# Patient Record
Sex: Female | Born: 1970 | Race: Black or African American | Hispanic: No | Marital: Single | Smoking: Never smoker
Health system: Southern US, Community
[De-identification: ages and names within clinical notes are randomized; demographics above are authoritative.]

## PROBLEM LIST (undated history)

## (undated) DIAGNOSIS — B977 Papillomavirus as the cause of diseases classified elsewhere: Secondary | ICD-10-CM

## (undated) DIAGNOSIS — T7840XA Allergy, unspecified, initial encounter: Secondary | ICD-10-CM

## (undated) HISTORY — DX: Allergy, unspecified, initial encounter: T78.40XA

---

## 2014-03-18 ENCOUNTER — Ambulatory Visit (INDEPENDENT_AMBULATORY_CARE_PROVIDER_SITE_OTHER): Payer: 59 | Admitting: Family Medicine

## 2014-03-18 ENCOUNTER — Other Ambulatory Visit: Payer: Self-pay | Admitting: Family Medicine

## 2014-03-18 VITALS — BP 112/60 | HR 82 | Temp 97.9°F | Resp 14 | Ht 72.5 in | Wt 194.4 lb

## 2014-03-18 DIAGNOSIS — IMO0001 Reserved for inherently not codable concepts without codable children: Secondary | ICD-10-CM

## 2014-03-18 DIAGNOSIS — N898 Other specified noninflammatory disorders of vagina: Secondary | ICD-10-CM

## 2014-03-18 DIAGNOSIS — Z0000 Encounter for general adult medical examination without abnormal findings: Secondary | ICD-10-CM

## 2014-03-18 DIAGNOSIS — Z309 Encounter for contraceptive management, unspecified: Secondary | ICD-10-CM

## 2014-03-18 DIAGNOSIS — B351 Tinea unguium: Secondary | ICD-10-CM

## 2014-03-18 DIAGNOSIS — Z1231 Encounter for screening mammogram for malignant neoplasm of breast: Secondary | ICD-10-CM

## 2014-03-18 LAB — POCT URINALYSIS DIPSTICK
Blood, UA: NEGATIVE
Glucose, UA: NEGATIVE
Ketones, UA: 15
Leukocytes, UA: NEGATIVE
Nitrite, UA: NEGATIVE
Spec Grav, UA: 1.025
Urobilinogen, UA: 4
pH, UA: 5.5

## 2014-03-18 LAB — POCT WET PREP WITH KOH
Clue Cells Wet Prep HPF POC: NEGATIVE
KOH Prep POC: NEGATIVE
Trichomonas, UA: NEGATIVE
Yeast Wet Prep HPF POC: NEGATIVE

## 2014-03-18 LAB — POCT UA - MICROSCOPIC ONLY
Casts, Ur, LPF, POC: NEGATIVE
Crystals, Ur, HPF, POC: NEGATIVE
Yeast, UA: NEGATIVE

## 2014-03-18 LAB — POCT URINE PREGNANCY: Preg Test, Ur: NEGATIVE

## 2014-03-18 MED ORDER — TERBINAFINE HCL 250 MG PO TABS: 250.0000 mg | ORAL_TABLET | Freq: Every day | ORAL | Status: DC

## 2014-03-18 MED ORDER — ETONOGESTREL-ETHINYL ESTRADIOL 0.12-0.015 MG/24HR VA RING: VAGINAL_RING | VAGINAL | Status: DC

## 2014-03-18 NOTE — Progress Notes (Signed)
Chief Complaint:  Chief Complaint  Patient presents with  . Annual Exam    HPI: Tracy Montoya is a [age] y.o. female who is here for annual with pap Last annual was 3-4 years ago [facility] de[facility] Has had abnormal pap yearsa go, when she was 24 G1A0L1 Menarche 14-17 Every 25-30 days, light flow 4-5 days, minimal cramping, no clots Does not do self breast exam Deneis breast, uterine, cervical cancer in the family Has not had mammogram Would like birth control, does not want depo or pills or implant, nonsmoker, no h/o DVTs No urinary sxs.   Past Medical History  Diagnosis Date  . Allergy    History reviewed. No pertinent past surgical history. History   Social History  . Marital Status: Single    Spouse Name: N/A    Number of Children: N/A  . Years of Education: N/A   Social History Main Topics  . Smoking status: None  . Smokeless tobacco: None  . Alcohol Use: None  . Drug Use: None  . Sexual Activity: None   Other Topics Concern  . None   Social History Narrative  . None   Family History  Problem Relation Age of Onset  . Cancer Maternal Grandfather    No Known Allergies Prior to Admission medications   Not on File     ROS: The patient denies fevers, chills, night sweats, unintentional weight loss, chest pain, palpitations, wheezing, dyspnea on exertion, nausea, vomiting, abdominal pain, dysuria, hematuria, melena, numbness, weakness, or tingling.   All other systems have been reviewed and were otherwise negative with the exception of those mentioned in the HPI and as above.    PHYSICAL EXAM: Filed Vitals:   03/18/14 1853  BP: 112/60  Pulse: 82  Temp: 97.9 F (36.6 C)   Filed Vitals:   03/18/14 1853  Height: 6' 0.5" (1.842 m)  Weight: 194 lb 6.4 oz (88.179 kg)   Body mass index is 25.99 kg/(m^2).  General: Alert, no acute distress HEENT:  Normocephalic, atraumatic, oropharynx patent. EOMI, PERRLA, tm n l, fundo exam nl Cardiovascular:   Regular rate and rhythm, no rubs murmurs or gallops.  No Carotid bruits, radial pulse intact. No pedal edema.  Respiratory: Clear to auscultation bilaterally.  No wheezes, rales, or rhonchi.  No cyanosis, no use of accessory musculature GI: No organomegaly, abdomen is soft and non-tender, positive bowel sounds.  No masses. Skin: No rashes. Neurologic: Facial musculature symmetric. Psychiatric: Patient is appropriate throughout our interaction. Lymphatic: No cervical lymphadenopathy Musculoskeletal: Gait intact. Breast and GU exam normal Cervix normal  LABS: Results for orders placed in visit on 03/18/14  POCT URINALYSIS DIPSTICK      Result Value Ref Range   Color, UA yellow     Clarity, UA clear     Glucose, UA neg     Bilirubin, UA small     Ketones, UA 15     Spec Grav, UA 1.025     Blood, UA neg     pH, UA 5.5     Protein, UA trace     Urobilinogen, UA 4.0     Nitrite, UA neg     Leukocytes, UA Negative    POCT UA - MICROSCOPIC ONLY      Result Value Ref Range   WBC, Ur, HPF, POC 2-4     RBC, urine, microscopic 0-1     Bacteria, U Microscopic trace     Mucus, UA 1+  Epithelial cells, urine per micros 2-3     Crystals, Ur, HPF, POC neg     Casts, Ur, LPF, POC neg     Yeast, UA neg    POCT WET PREP WITH KOH      Result Value Ref Range   Trichomonas, UA Negative     Clue Cells Wet Prep HPF POC neg     Epithelial Wet Prep HPF POC 1-3     Yeast Wet Prep HPF POC neg     Bacteria Wet Prep HPF POC small     RBC Wet Prep HPF POC 0-1     WBC Wet Prep HPF POC 2-4     KOH Prep POC Negative       EKG/XRAY:   Primary read interpreted by Dr. Conley Rolls at [facility].   ASSESSMENT/PLAN: Encounter Diagnoses  Name Primary?  . Annual physical exam Yes  . Onychomycosis   . Birth control   . Visit for screening mammogram   . Vaginal discharge    Rx nuvaring Rx Lamisil but not to take for another month until see if she has any SEs from nuvaring Return for fasting labs Today pap2,  urine and wet prep Screening mammogram pending Will let me know about GF colon cancer age at dx F/u prn   Gross sideeffects, risk and benefits, and alternatives of medications d/w patient. Patient is aware that all medications have potential sideeffects and we are unable to predict every sideeffect or drug-drug interaction that may occur.  Tracy Sonnen PHUONG, DO 03/18/2014 8:15 PM

## 2014-03-18 NOTE — Patient Instructions (Signed)

## 2014-03-19 ENCOUNTER — Telehealth: Payer: Self-pay

## 2014-03-19 DIAGNOSIS — Z01419 Encounter for gynecological examination (general) (routine) without abnormal findings: Secondary | ICD-10-CM

## 2014-03-19 NOTE — Telephone Encounter (Signed)
Patient called and I advised of the message below. Patient transferred to manager.

## 2014-03-19 NOTE — Telephone Encounter (Signed)
I have called and left a detailed message for the patient on her voice mail.  I have changed her pap smear to include gonorrhea and chlamydia.  If she is also interested in blood STD testing (HIV syphilis and Hepatitis) please let me know.  These labs should be covered because one of her diagnoses was vaginal discharge but the coverage will be according to her insurance regarding her STD testing.  She will be charge to RTC on Sunday because her agreement with her insurance company is that she will pay a copay at the time of service and because her blood will be drawn on a different day she will have to pay a copay for the blood draw.  That is the agreement with her insurance company.  We do not always have a clinic person available to talk to the patient on the phone and that is why she could not talk with someone.  Her phone call went into a phone system and she was going to be called back within 48h of her original phone call.   LAB -- please call and change her pap to a PAP #6 I have ordered it under yesterdays visit in EPIC.  Please see me if there are any questions.  Thanks

## 2014-03-19 NOTE — Telephone Encounter (Signed)
Patient is calling again wanting to know if she could have an STD screening done as part of her annual physical when she comes back on Sunday for her lab work. Patient states she was not told that we would not test for STD when we did pap. °

## 2014-03-19 NOTE — Telephone Encounter (Deleted)
Patient is calling again wanting to know if she could have an STD screening done as part of her annual physical when she comes back on Sunday for her lab work. Patient states she was not told that we would not test for STD when we did pap.

## 2014-03-19 NOTE — Telephone Encounter (Signed)
Pt called about a pap she had done last night, she did not mention to the Dr. Claiborne Billings she also wanted an STD test to be done, wants to know if it is too late, and if it is, if she can come in and still include this as part of her physical exam

## 2014-03-19 NOTE — Telephone Encounter (Signed)
Patient is upset because her STD was not included in her OV last night.   She is also upset b/c no one was available to answer her question.  Or statement"  THE STD IS COVERED UNDER THE WELLNESS VISIT, AND I WILL NOT BE CHARGED EXTRA for returning on Sunday for her fasting labs and STD testing."  Please leave a detailed message on her voice machine.  One) Explain why we do not automatically do an STD during a wellness OV IF this is true.  Two)  Explain why someone was not available to talk with the patient when she called and now has to play phone tag with her.   [phone]  This is her only number according to her.

## 2014-03-20 ENCOUNTER — Encounter: Payer: Self-pay | Admitting: Family Medicine

## 2014-03-23 ENCOUNTER — Encounter: Payer: Self-pay | Admitting: Family Medicine

## 2014-03-23 LAB — PAP IG, CT-NG NAA, HPV HIGH-RISK
Chlamydia Probe Amp: NEGATIVE
GC Probe Amp: NEGATIVE
HPV DNA High Risk: NOT DETECTED

## 2014-03-23 NOTE — Telephone Encounter (Signed)
LM about normal pap, she can return for labs and per my conversation with Laurelyn SickleKatina we would wave the co pay since I was in error about no copay for labs. I had told her she would have a lab fee but did not know that she would  Also have to pay a co pay on top of her [facility] lab fees and venipuncture and handling.

## 2014-03-25 ENCOUNTER — Encounter: Payer: Self-pay | Admitting: Family Medicine

## 2014-03-27 ENCOUNTER — Ambulatory Visit (HOSPITAL_COMMUNITY): Payer: Self-pay | Attending: Family Medicine

## 2014-04-07 ENCOUNTER — Encounter: Payer: Self-pay | Admitting: Family Medicine

## 2015-04-08 ENCOUNTER — Other Ambulatory Visit: Payer: Self-pay | Admitting: Family Medicine

## 2015-04-08 NOTE — Telephone Encounter (Signed)
Patient is requesting refill on birth control  She was last seen [id number].  Please call ASAP  [phone] (H)

## 2015-04-09 NOTE — Telephone Encounter (Signed)
Does pt need to RTC? I called pt to see when she could come in. Left message on VM.

## 2015-04-10 ENCOUNTER — Other Ambulatory Visit: Payer: Self-pay | Admitting: Family Medicine

## 2015-04-10 MED ORDER — ETONOGESTREL-ETHINYL ESTRADIOL 0.12-0.015 MG/24HR VA RING: VAGINAL_RING | VAGINAL | Status: DC

## 2015-04-10 NOTE — Progress Notes (Signed)
Refilled for 3 nuvarings, she needs to come in int he next 1-3 months for recheck.

## 2016-10-03 ENCOUNTER — Encounter: Payer: Self-pay | Admitting: Physician Assistant

## 2016-10-03 ENCOUNTER — Ambulatory Visit (INDEPENDENT_AMBULATORY_CARE_PROVIDER_SITE_OTHER): Payer: 59 | Admitting: Physician Assistant

## 2016-10-03 VITALS — BP 118/72 | HR 78 | Temp 98.3°F | Resp 16 | Ht 72.5 in | Wt 177.0 lb

## 2016-10-03 DIAGNOSIS — Z130 Encounter for screening for diseases of the blood and blood-forming organs and certain disorders involving the immune mechanism: Secondary | ICD-10-CM | POA: Diagnosis not present

## 2016-10-03 DIAGNOSIS — Z113 Encounter for screening for infections with a predominantly sexual mode of transmission: Secondary | ICD-10-CM

## 2016-10-03 DIAGNOSIS — Z114 Encounter for screening for human immunodeficiency virus [HIV]: Secondary | ICD-10-CM | POA: Diagnosis not present

## 2016-10-03 DIAGNOSIS — Z0000 Encounter for general adult medical examination without abnormal findings: Secondary | ICD-10-CM | POA: Diagnosis not present

## 2016-10-03 DIAGNOSIS — Z23 Encounter for immunization: Secondary | ICD-10-CM | POA: Diagnosis not present

## 2016-10-03 DIAGNOSIS — Z3044 Encounter for surveillance of vaginal ring hormonal contraceptive device: Secondary | ICD-10-CM | POA: Diagnosis not present

## 2016-10-03 DIAGNOSIS — Z1329 Encounter for screening for other suspected endocrine disorder: Secondary | ICD-10-CM

## 2016-10-03 DIAGNOSIS — Z13220 Encounter for screening for lipoid disorders: Secondary | ICD-10-CM | POA: Diagnosis not present

## 2016-10-03 DIAGNOSIS — Z1389 Encounter for screening for other disorder: Secondary | ICD-10-CM

## 2016-10-03 DIAGNOSIS — B351 Tinea unguium: Secondary | ICD-10-CM

## 2016-10-03 DIAGNOSIS — H409 Unspecified glaucoma: Secondary | ICD-10-CM | POA: Diagnosis not present

## 2016-10-03 DIAGNOSIS — Z13228 Encounter for screening for other metabolic disorders: Secondary | ICD-10-CM

## 2016-10-03 LAB — POCT URINALYSIS DIP (MANUAL ENTRY)
Bilirubin, UA: NEGATIVE
Blood, UA: NEGATIVE
GLUCOSE UA: NEGATIVE
Ketones, POC UA: NEGATIVE
LEUKOCYTES UA: NEGATIVE
NITRITE UA: NEGATIVE
PROTEIN UA: NEGATIVE
SPEC GRAV UA: 1.015
UROBILINOGEN UA: 0.2
pH, UA: 7

## 2016-10-03 MED ORDER — ETONOGESTREL-ETHINYL ESTRADIOL 0.12-0.015 MG/24HR VA RING: VAGINAL_RING | VAGINAL | 4 refills | Status: DC

## 2016-10-03 MED ORDER — LEVONORGESTREL 1.5 MG PO TABS: 1.5000 mg | ORAL_TABLET | Freq: Once | ORAL | 0 refills | Status: AC

## 2016-10-03 MED ORDER — TERBINAFINE HCL 250 MG PO TABS: 250.0000 mg | ORAL_TABLET | Freq: Every day | ORAL | 0 refills | Status: DC

## 2016-10-03 NOTE — Progress Notes (Signed)
Patient ID: Tracy Montoya, female    DOB: 12/24/1970, 45 y.o.   MRN: [id number]  PCP: No PCP Per Patient  Chief Complaint  Patient presents with  . Annual Exam    with pap   . Medication Refill    wants a refill on her nuvaring as well as back up birth control   . Other    patient also wants to have some more medication for her toenail fungus.  She states she never did get the Rx filled    Subjective:   Presents for [employer] [facility].  Last pap 03/18/2014. Cytology negative, HPV negative Has had abnormal pap years ago, when she was 24. Had a biopsy and then cryotherapy. G1A0L1 Menarche [age] Every 25-30 days, light flow 4-5 days, minimal cramping, no clots Does not do self breast exam Mammogram this year. [place] [facility] [place]. Denies breast, uterine, cervical cancer in the family.  Declined seasonal influenza vaccine. "I just don't get that." Needs HIV screening. Desires STI screening. Thinks she had Tdap in 2007.  Rx'd terbinafine 03/2014, never filled the Rx. Is interested in having a new Rx.     Patient Active Problem List   Diagnosis Date Noted  . Glaucoma 10/03/2016    Past Medical History:  Diagnosis Date  . Allergy      Prior to Admission medications   Medication Sig Start Date End Date Taking? Authorizing Provider  etonogestrel-ethinyl estradiol (NUVARING) 0.12-0.015 MG/24HR vaginal ring INSERT VAGINALLY AND LEAVE IN PLACE FOR 3 CONSECUTIVE WEEKS, THEN REMOVE FOR 1 WEEK 04/10/15  Yes Thao P Le, DO  Travoprost, BAK Free, (TRAVATAN) 0.004 % SOLN ophthalmic solution      yes   terbinafine (LAMISIL) 250 MG tablet Take 1 tablet (250 mg total) by mouth daily. 03/18/14  Yes Thao P Le, DO    No Known Allergies  History reviewed. No pertinent surgical history.  Family History  Problem Relation Age of Onset  . Cancer Maternal Grandfather     Social History   Social History  . Marital status: Single    Spouse name: N/A  . Number  of children: 1  . Years of education: Associate's Degree   Occupational History  . Shipping     [name]   Social History Main Topics  . Smoking status: Never Smoker  . Smokeless tobacco: Never Used  . Alcohol use No  . Drug use: No  . Sexual activity: Yes    Partners: Male    Birth control/ protection: Other-see comments     Comment: NuvaRing   Other Topics Concern  . None   Social History Narrative   Lives with her adult daughter.   Attended [employer]. Obtained GED and then Associate's Degree at [employer].       Review of Systems  Constitutional: Negative for chills and fever.  HENT: Negative for congestion and sore throat.   Eyes: Negative for pain.  Respiratory: Negative for cough, shortness of breath and wheezing.   Cardiovascular: Negative for chest pain, palpitations and leg swelling.  Gastrointestinal: Negative for abdominal pain, blood in stool, constipation, diarrhea, nausea and vomiting.  Endocrine: Negative for polydipsia.  Genitourinary: Negative for dysuria, frequency, hematuria and urgency.  Musculoskeletal: Negative for back pain, myalgias and neck pain.  Skin: Negative for rash and wound.       Toenails thickened and discolored  Allergic/Immunologic: Negative for environmental allergies.  Neurological: Negative for dizziness, weakness and headaches.  Hematological: Does  not bruise/bleed easily.  Psychiatric/Behavioral: Negative for suicidal ideas. The patient is not nervous/anxious.         Objective:  Physical Exam  Constitutional: She is oriented to person, place, and time. Vital signs are normal. She appears well-developed and well-nourished. She is active and cooperative. No distress.  BP 118/72 (BP Location: Right Arm, Patient Position: Sitting, Cuff Size: Normal)   Pulse 78   Temp 98.3 F (36.8 C) (Oral)   Resp 16   Ht 6' 0.5" (1.842 m)   Wt 177 lb (80.3 kg)   LMP 09/08/2016   SpO2 100%   BMI 23.68 kg/m    HENT:  Head: Normocephalic  and atraumatic.  Right Ear: Hearing, tympanic membrane, external ear and ear canal normal. No foreign bodies.  Left Ear: Hearing, tympanic membrane, external ear and ear canal normal. No foreign bodies.  Nose: Nose normal.  Mouth/Throat: Uvula is midline, oropharynx is clear and moist and mucous membranes are normal. No oral lesions. Normal dentition. No dental abscesses or uvula swelling. No oropharyngeal exudate.  Eyes: Conjunctivae, EOM and lids are normal. Pupils are equal, round, and reactive to light. Right eye exhibits no discharge. Left eye exhibits no discharge. No scleral icterus.  Fundoscopic exam:      The right eye shows no arteriolar narrowing, no AV nicking, no exudate, no hemorrhage and no papilledema. The right eye shows red reflex.       The left eye shows no arteriolar narrowing, no AV nicking, no exudate, no hemorrhage and no papilledema. The left eye shows red reflex.  Neck: Trachea normal, normal range of motion and full passive range of motion without pain. Neck supple. No spinous process tenderness and no muscular tenderness present. No thyroid mass and no thyromegaly present.  Cardiovascular: Normal rate, regular rhythm, normal heart sounds, intact distal pulses and normal pulses.   Pulmonary/Chest: Effort normal and breath sounds normal. Right breast exhibits no inverted nipple, no mass, no nipple discharge, no skin change and no tenderness. Left breast exhibits no inverted nipple, no mass, no nipple discharge, no skin change and no tenderness. Breasts are symmetrical.  Musculoskeletal: She exhibits no edema or tenderness.       Cervical back: Normal.       Thoracic back: Normal.       Lumbar back: Normal.  Lymphadenopathy:       Head (right side): No tonsillar, no preauricular, no posterior auricular and no occipital adenopathy present.       Head (left side): No tonsillar, no preauricular, no posterior auricular and no occipital adenopathy present.    She has no  cervical adenopathy.       Right: No supraclavicular adenopathy present.       Left: No supraclavicular adenopathy present.  Neurological: She is alert and oriented to person, place, and time. She has normal strength and normal reflexes. No cranial nerve deficit. She exhibits normal muscle tone. Coordination and gait normal.  Skin: Skin is warm, dry and intact. No rash noted. She is not diaphoretic. No cyanosis or erythema. Nails show no clubbing.  Psychiatric: She has a normal mood and affect. Her speech is normal and behavior is normal. Judgment and thought content normal.           Assessment & Plan:  1. Annual physical exam Age appropriate anticipatory guidance provided. - Care order/instruction:  2. Encounter for surveillance of vaginal ring hormonal contraceptive device Continue NuvaRing with PRN Plan B.  - etonogestrel-ethinyl estradiol (NUVARING)  0.12-0.015 MG/24HR vaginal ring; INSERT VAGINALLY AND LEAVE IN PLACE FOR 3 CONSECUTIVE WEEKS, THEN REMOVE FOR 1 WEEK  Dispense: 3 each; Refill: 4 - levonorgestrel (PLAN B ONE-STEP) 1.5 MG tablet; Take 1 tablet (1.5 mg total) by mouth once.  Dispense: 1 tablet; Refill: 0  3. Screening for blood or protein in urine - POCT urinalysis dipstick - Urine Microscopic  4. Screening for metabolic disorder - Comprehensive metabolic panel  5. Screening for HIV (human immunodeficiency virus) - HIV antibody  6. Screening for hyperlipidemia - Lipid panel  7. Screening for thyroid disorder - TSH  8. Screening for deficiency anemia - CBC with Differential/Platelet  9. Need for Tdap vaccination - Tdap vaccine greater than or equal to 7yo IM  10. Onychomycosis RTC 4 weeks after starting terbinafine for ALT, AST. - terbinafine (LAMISIL) 250 MG tablet; Take 1 tablet (250 mg total) by mouth daily.  Dispense: 90 tablet; Refill: 0  11. Routine screening for STI (sexually transmitted infection) Encouraged consistent condom use. - Chlamydia  trachomatis, DNA, amp probe - Gonococcus DNA, PCR - Hepatitis B surface antibody - Hepatitis B surface antigen - Hepatitis C antibody - RPR  12. Glaucoma, unspecified glaucoma type, unspecified laterality Managed by ophthalmology and optometry.   Fernande Bras, PA-C Physician Assistant-Certified [facility]

## 2016-10-03 NOTE — Patient Instructions (Addendum)
   IF you received an x-ray today, you will receive an invoice from [facility]. Please contact [facility] at [phone] with questions or concerns regarding your invoice.   IF you received labwork today, you will receive an invoice from [facility]. Please contact [facility] at [phone] with questions or concerns regarding your invoice.   Our billing staff will not be able to assist you with questions regarding bills from these companies.  You will be contacted with the lab results as soon as they are available. The fastest way to get your results is to activate your My Chart account. Instructions are located on the last page of this paperwork. If you have not heard from us regarding the results in 2 weeks, please contact this office.    Keeping You Healthy  Get These Tests 1. Blood Pressure- Have your blood pressure checked once a year by your health care provider.  Normal blood pressure is 120/80. 2. Weight- Have your body mass index (BMI) calculated to screen for obesity.  BMI is measure of body fat based on height and weight.  You can also calculate your own BMI at www.nhlbisupport.com/bmi/. 3. Cholesterol- Have your cholesterol checked every 5 years starting at age 20 then yearly starting at age 45. 4. Chlamydia, HIV, and other sexually transmitted diseases- Get screened every year until age 25, then within three months of each new sexual provider. 5. Pap Test - Every 1-5 years; discuss with your health care provider. 6. Mammogram- Every 1-2 years starting at age 40--50  Take these medicines  Calcium with Vitamin D-Your body needs 1200 mg of Calcium each day and 800-1000 IU of Vitamin D daily.  Your body can only absorb 500 mg of Calcium at a time so Calcium must be taken in 2 or 3 divided doses throughout the day.  Multivitamin with folic acid- Once daily if it is possible for you to become pregnant.  Get these Immunizations  Gardasil-Series of three doses;  prevents HPV related illness such as genital warts and cervical cancer.  Menactra-Single dose; prevents meningitis.  Tetanus shot- Every 10 years.  Flu shot-Every year.  Take these steps 1. Do not smoke-Your healthcare provider can help you quit.  For tips on how to quit go to www.smokefree.gov or call 1-800 QUITNOW. 2. Be physically active- Exercise 5 days a week for at least 30 minutes.  If you are not already physically active, start slow and gradually work up to 30 minutes of moderate physical activity.  Examples of moderate activity include walking briskly, dancing, swimming, bicycling, etc. 3. Breast Cancer- A self breast exam every month is important for early detection of breast cancer.  For more information and instruction on self breast exams, ask your healthcare provider or www.womenshealth.gov/faq/breast-self-exam.cfm. 4. Eat a healthy diet- Eat a variety of healthy foods such as fruits, vegetables, whole grains, low fat milk, low fat cheeses, yogurt, lean meats, poultry and fish, beans, nuts, tofu, etc.  For more information go to www. Thenutritionsource.org 5. Drink alcohol in moderation- Limit alcohol intake to one drink or less per day. Never drink and drive. 6. Depression- Your emotional health is as important as your physical health.  If you're feeling down or losing interest in things you normally enjoy please talk to your healthcare provider about being screened for depression. 7. Dental visit- Brush and floss your teeth twice daily; visit your dentist twice a year. 8. Eye doctor- Get an eye exam at least every 2 years. 9. Helmet   use- Always wear a helmet when riding a bicycle, motorcycle, rollerblading or skateboarding. 10. Safe sex- If you may be exposed to sexually transmitted infections, use a condom. 11. Seat belts- Seat belts can save your live; always wear one. 12. Smoke/Carbon Monoxide detectors- These detectors need to be installed on the appropriate level of your  home. Replace batteries at least once a year. 13. Skin cancer- When out in the sun please cover up and use sunscreen 15 SPF or higher. 14. Violence- If anyone is threatening or hurting you, please tell your healthcare provider.        

## 2016-10-04 LAB — COMPREHENSIVE METABOLIC PANEL
A/G RATIO: 1.7 (ref 1.2–2.2)
ALK PHOS: 42 IU/L (ref 39–117)
ALT: 14 IU/L (ref 0–32)
AST: 15 IU/L (ref 0–40)
Albumin: 4.3 g/dL (ref 3.5–5.5)
BILIRUBIN TOTAL: 0.7 mg/dL (ref 0.0–1.2)
BUN/Creatinine Ratio: 14 (ref 9–23)
BUN: 12 mg/dL (ref 6–24)
CHLORIDE: 101 mmol/L (ref 96–106)
CO2: 25 mmol/L (ref 18–29)
Calcium: 9.6 mg/dL (ref 8.7–10.2)
Creatinine, Ser: 0.86 mg/dL (ref 0.57–1.00)
GFR calc Af Amer: 96 mL/min/{1.73_m2} (ref >59)
GFR calc non Af Amer: 83 mL/min/{1.73_m2} (ref >59)
GLOBULIN, TOTAL: 2.6 g/dL (ref 1.5–4.5)
Glucose: 92 mg/dL (ref 65–99)
POTASSIUM: 4.4 mmol/L (ref 3.5–5.2)
SODIUM: 140 mmol/L (ref 134–144)
Total Protein: 6.9 g/dL (ref 6.0–8.5)

## 2016-10-04 LAB — CBC WITH DIFFERENTIAL/PLATELET
BASOS: 1 %
Basophils Absolute: 0 10*3/uL (ref 0.0–0.2)
EOS (ABSOLUTE): 0.1 10*3/uL (ref 0.0–0.4)
EOS: 2 %
HEMATOCRIT: 40.4 % (ref 34.0–46.6)
Hemoglobin: 13.5 g/dL (ref 11.1–15.9)
Immature Grans (Abs): 0 10*3/uL (ref 0.0–0.1)
Immature Granulocytes: 0 %
Lymphocytes Absolute: 1.8 10*3/uL (ref 0.7–3.1)
Lymphs: 32 %
MCH: 30.5 pg (ref 26.6–33.0)
MCHC: 33.4 g/dL (ref 31.5–35.7)
MCV: 91 fL (ref 79–97)
Monocytes Absolute: 0.4 10*3/uL (ref 0.1–0.9)
Monocytes: 6 %
Neutrophils Absolute: 3.4 10*3/uL (ref 1.4–7.0)
Neutrophils: 59 %
Platelets: 252 10*3/uL (ref 150–379)
RBC: 4.43 x10E6/uL (ref 3.77–5.28)
RDW: 13.8 % (ref 12.3–15.4)
WBC: 5.8 10*3/uL (ref 3.4–10.8)

## 2016-10-04 LAB — LIPID PANEL
CHOLESTEROL TOTAL: 163 mg/dL (ref 100–199)
Chol/HDL Ratio: 2.2 ratio units (ref 0.0–4.4)
HDL: 73 mg/dL (ref >39)
LDL Calculated: 79 mg/dL (ref 0–99)
Triglycerides: 56 mg/dL (ref 0–149)
VLDL Cholesterol Cal: 11 mg/dL (ref 5–40)

## 2016-10-04 LAB — URINALYSIS, MICROSCOPIC ONLY
Bacteria, UA: NONE SEEN
Casts: NONE SEEN /lpf
RBC MICROSCOPIC, UA: NONE SEEN /HPF (ref 0–?)

## 2016-10-04 LAB — RPR: RPR Ser Ql: NONREACTIVE

## 2016-10-04 LAB — HEPATITIS B SURFACE ANTIBODY, QUANTITATIVE: Hepatitis B Surf Ab Quant: <3.1 m[IU]/mL — ABNORMAL LOW

## 2016-10-04 LAB — HEPATITIS B SURFACE ANTIGEN: Hepatitis B Surface Ag: NEGATIVE

## 2016-10-04 LAB — HEPATITIS C ANTIBODY: Hep C Virus Ab: <0.1 {s_co_ratio} (ref 0.0–0.9)

## 2016-10-04 LAB — HIV ANTIBODY (ROUTINE TESTING W REFLEX): HIV Screen 4th Generation wRfx: NONREACTIVE

## 2016-10-04 LAB — TSH: TSH: 0.689 u[IU]/mL (ref 0.450–4.500)

## 2016-10-05 ENCOUNTER — Encounter: Payer: Self-pay | Admitting: Physician Assistant

## 2016-10-05 LAB — CHLAMYDIA TRACHOMATIS, DNA, AMP PROBE: Chlamydia trachomatis, NAA: NEGATIVE

## 2016-10-05 LAB — GONOCOCCUS DNA, PCR: Neisseria gonorrhoeae by PCR: NEGATIVE

## 2016-10-10 ENCOUNTER — Encounter: Payer: Self-pay | Admitting: Physician Assistant

## 2016-11-24 ENCOUNTER — Telehealth: Payer: Self-pay | Admitting: Family Medicine

## 2016-11-24 DIAGNOSIS — Z3044 Encounter for surveillance of vaginal ring hormonal contraceptive device: Secondary | ICD-10-CM

## 2016-11-24 MED ORDER — ETONOGESTREL-ETHINYL ESTRADIOL 0.12-0.015 MG/24HR VA RING: VAGINAL_RING | VAGINAL | 4 refills | Status: DC

## 2016-11-24 NOTE — Telephone Encounter (Signed)
Pt calling stating that she need her Nuvaring sent to [facility] because its a maintenance drug the pharmacy want fill it please call [facility] at [phone] Fax number [phone]

## 2017-02-28 ENCOUNTER — Ambulatory Visit (INDEPENDENT_AMBULATORY_CARE_PROVIDER_SITE_OTHER): Payer: 59

## 2017-02-28 ENCOUNTER — Encounter: Payer: Self-pay | Admitting: Physician Assistant

## 2017-02-28 ENCOUNTER — Ambulatory Visit (INDEPENDENT_AMBULATORY_CARE_PROVIDER_SITE_OTHER): Payer: 59 | Admitting: Physician Assistant

## 2017-02-28 VITALS — BP 121/70 | HR 75 | Temp 98.3°F | Resp 18 | Ht 72.44 in | Wt 180.6 lb

## 2017-02-28 DIAGNOSIS — M25562 Pain in left knee: Secondary | ICD-10-CM

## 2017-02-28 MED ORDER — MELOXICAM 15 MG PO TABS: 15.0000 mg | ORAL_TABLET | Freq: Every day | ORAL | 0 refills | Status: DC

## 2017-02-28 NOTE — Patient Instructions (Addendum)
Apply ice to the painful area for 15-20 minutes twice a day. STOP the ibuprofen. Take meloxicam instead.  If your symptoms are not improving in 7-10 days, let me know. I will plan to refer you to orthopedics.    IF you received an x-ray today, you will receive an invoice from [facility]. Please contact [facility] at [phone] with questions or concerns regarding your invoice.   IF you received labwork today, you will receive an invoice from [facility]. Please contact [facility] at [phone] with questions or concerns regarding your invoice.   Our billing staff will not be able to assist you with questions regarding bills from these companies.  You will be contacted with the lab results as soon as they are available. The fastest way to get your results is to activate your My Chart account. Instructions are located on the last page of this paperwork. If you have not heard from us regarding the results in 2 weeks, please contact this office.

## 2017-02-28 NOTE — Progress Notes (Signed)
Patient ID: Tracy Montoya, female    DOB: May 25, 1971, 46 y.o.   MRN: [id number]  PCP: Porfirio OarJeffery, Mortimer Bair, PA-C  Chief Complaint  Patient presents with  . Knee Pain    X 6 days- left knee pain     Subjective:   Presents for evaluation of LEFT knee pain x 6 days.  She has a history of pain in the LEFT knee, but was fine until she started jogging last week. Thinks that she aggravated a previous injury. Ibuprofen helps "take the edge off." Ben-Gay wraps seem to help. The pain is described as an ache, worse in the morning.  No swelling, clicking, popping, stiffness. No redness, paresthesias.  Review of Systems As above    Patient Active Problem List   Diagnosis Date Noted  . Glaucoma 10/03/2016     Prior to Admission medications   Medication Sig Start Date End Date Taking? Authorizing Provider  etonogestrel-ethinyl estradiol (NUVARING) 0.12-0.015 MG/24HR vaginal ring INSERT VAGINALLY AND LEAVE IN PLACE FOR 3 CONSECUTIVE WEEKS, THEN REMOVE FOR 1 WEEK 11/24/16  Yes Danialle Dement, PA-C  terbinafine (LAMISIL) 250 MG tablet Take 1 tablet (250 mg total) by mouth daily. 10/03/16  Yes Lizvette Lightsey, PA-C  Travoprost, BAK Free, (TRAVATAN) 0.004 % SOLN ophthalmic solution 1 drop at bedtime.   Yes [provider]     No Known Allergies     Objective:  Physical Exam  Constitutional: She is oriented to person, place, and time. She appears well-developed and well-nourished. She is active and cooperative. No distress.  BP 121/70 (BP Location: Right Arm, Patient Position: Sitting, Cuff Size: Small)   Pulse 75   Temp 98.3 F (36.8 C) (Oral)   Resp 18   Ht 6' 0.44" (1.84 m)   Wt 180 lb 9.6 oz (81.9 kg)   LMP 02/07/2017 (Exact Date)   SpO2 99%   BMI 24.20 kg/m    Eyes: Conjunctivae are normal.  Pulmonary/Chest: Effort normal.  Musculoskeletal:       Left knee: She exhibits decreased range of motion (flexion beyond 90 degress too painful to tolerate) and swelling  (mild). She exhibits no effusion, no ecchymosis, no deformity, no laceration, no erythema, normal alignment, no LCL laxity, normal patellar mobility, no bony tenderness, normal meniscus and no MCL laxity. Tenderness found. Medial joint line and MCL tenderness noted. No lateral joint line, no LCL and no patellar tendon tenderness noted.       Legs: Neurological: She is alert and oriented to person, place, and time.  Skin: Skin is warm, dry and intact.  Psychiatric: She has a normal mood and affect. Her speech is normal and behavior is normal.       Dg Knee Complete 4 Views Left  Result Date: 02/28/2017 CLINICAL DATA:  Medial left knee pain.  No known injury. EXAM: LEFT KNEE - COMPLETE 4+ VIEW COMPARISON:  No recent prior . FINDINGS: No acute bony or joint abnormality identified. No evidence of fracture or dislocation. No focal abnormality. IMPRESSION: No acute abnormality. Electronically Signed   By: Maisie Fushomas  Register   On: 02/28/2017 17:24       Assessment & Plan:   1. Left medial knee pain Try change from ibuprofen to meloxicam. Knee sleeve for compression and support. Rest. Ice. If not improving, would likely refer to orthopedics. - DG Knee Complete 4 Views Left; Future - meloxicam (MOBIC) 15 MG tablet; Take 1 tablet (15 mg total) by mouth daily.  Dispense: 30 tablet; Refill: 0 -  Apply knee sleeve    Return if symptoms worsen or fail to improve.   Fernande Bras, PA-C Primary Care at [facility]

## 2017-06-01 ENCOUNTER — Telehealth: Payer: Self-pay | Admitting: Physician Assistant

## 2017-06-02 ENCOUNTER — Encounter (HOSPITAL_COMMUNITY): Payer: Self-pay | Admitting: Family Medicine

## 2017-06-02 ENCOUNTER — Ambulatory Visit (HOSPITAL_COMMUNITY)
Admission: EM | Admit: 2017-06-02 | Discharge: 2017-06-02 | Disposition: A | Discharge status: Home / Self Care | Payer: Self-pay | Attending: Family Medicine | Admitting: Family Medicine

## 2017-06-02 DIAGNOSIS — N898 Other specified noninflammatory disorders of vagina: Secondary | ICD-10-CM

## 2017-06-02 DIAGNOSIS — Z79899 Other long term (current) drug therapy: Secondary | ICD-10-CM | POA: Insufficient documentation

## 2017-06-02 DIAGNOSIS — Z809 Family history of malignant neoplasm, unspecified: Secondary | ICD-10-CM | POA: Insufficient documentation

## 2017-06-02 DIAGNOSIS — N760 Acute vaginitis: Secondary | ICD-10-CM | POA: Insufficient documentation

## 2017-06-02 MED ORDER — LIDOCAINE HCL (PF) 1 % IJ SOLN
INTRAMUSCULAR | Status: AC
  Filled 2017-06-02: qty 2

## 2017-06-02 MED ORDER — AZITHROMYCIN 250 MG PO TABS
ORAL_TABLET | ORAL | Status: AC
  Filled 2017-06-02: qty 4

## 2017-06-02 MED ORDER — AZITHROMYCIN 250 MG PO TABS
1000.0000 mg | ORAL_TABLET | Freq: Once | ORAL | Status: AC
Start: 2017-06-02 — End: 2017-06-02
  Administered 2017-06-02: 1000 mg via ORAL

## 2017-06-02 MED ORDER — FLUCONAZOLE 150 MG PO TABS: 150.0000 mg | ORAL_TABLET | Freq: Every day | ORAL | 0 refills | Status: DC

## 2017-06-02 MED ORDER — CEFTRIAXONE SODIUM 250 MG IJ SOLR
250.0000 mg | Freq: Once | INTRAMUSCULAR | Status: AC
  Administered 2017-06-02: 250 mg via INTRAMUSCULAR

## 2017-06-02 MED ORDER — METRONIDAZOLE 500 MG PO TABS: 500.0000 mg | ORAL_TABLET | Freq: Two times a day (BID) | ORAL | 0 refills | Status: DC

## 2017-06-02 MED ORDER — CEFTRIAXONE SODIUM 250 MG IJ SOLR
INTRAMUSCULAR | Status: AC
  Filled 2017-06-02: qty 250

## 2017-06-02 MED ORDER — METRONIDAZOLE 500 MG PO TABS
500.0000 mg | ORAL_TABLET | Freq: Two times a day (BID) | ORAL | 0 refills | Status: DC
Start: 2017-06-02 — End: 2017-06-02

## 2017-06-02 NOTE — ED Provider Notes (Signed)
  [facility]   [id number] 06/02/17 Arrival Time: 1658  ASSESSMENT & PLAN:  1. Vaginal discharge   2. Vaginitis and vulvovaginitis     Meds ordered this encounter  Medications  . azithromycin (ZITHROMAX) tablet 1,000 mg  . cefTRIAXone (ROCEPHIN) injection 250 mg  . DISCONTD: metroNIDAZOLE (FLAGYL) 500 MG tablet    Sig: Take 1 tablet (500 mg total) by mouth 2 (two) times daily.    Dispense:  14 tablet    Refill:  0    Order Specific Question:   Supervising Provider    Answer:   Mardella Layman [id number]  . DISCONTD: fluconazole (DIFLUCAN) 150 MG tablet    Sig: Take 1 tablet (150 mg total) by mouth daily.    Dispense:  2 tablet    Refill:  0    Order Specific Question:   Supervising Provider    Answer:   Mardella Layman [id number]  . metroNIDAZOLE (FLAGYL) 500 MG tablet    Sig: Take 1 tablet (500 mg total) by mouth 2 (two) times daily.    Dispense:  14 tablet    Refill:  0    Order Specific Question:   Supervising Provider    Answer:   Mardella Layman [id number]  . fluconazole (DIFLUCAN) 150 MG tablet    Sig: Take 1 tablet (150 mg total) by mouth daily.    Dispense:  2 tablet    Refill:  0    Order Specific Question:   Supervising Provider    Answer:   Mardella Layman [[id number]]    Reviewed expectations re: course of current medical issues. Questions answered. Outlined signs and symptoms indicating need for more acute intervention. Patient verbalized understanding. After Visit Summary given.   SUBJECTIVE:  Tracy Montoya is a 45 y.o. female who presents with complaint of   ROS: As per HPI.   OBJECTIVE:  Vitals:   06/02/17 1715  BP: 120/90  Pulse: 92  Temp: 98.2 F (36.8 C)  SpO2: 100%     General appearance: alert; no distress Eyes: PERRLA; EOMI; conjunctiva normal HENT: normocephalic; atraumatic; TMs normal; nasal mucosa normal; oral mucosa normal Neck: supple Lungs: clear to auscultation bilaterally Heart: regular rate and rhythm   Past Medical  History:  Diagnosis Date  . Allergy      has a past medical history of Allergy.    Labs Reviewed  CERVICOVAGINAL ANCILLARY ONLY    Imaging: No results found.  No Known Allergies  Family History  Problem Relation Age of Onset  . Cancer Maternal Grandfather    History reviewed. No pertinent surgical history.       Deatra Canter, [place] 06/02/17 1816

## 2017-06-02 NOTE — ED Triage Notes (Signed)
Pt here for vaginal discharge and itching  

## 2017-06-04 LAB — CERVICOVAGINAL ANCILLARY ONLY
Bacterial vaginitis: NEGATIVE
Candida vaginitis: NEGATIVE
Chlamydia: NEGATIVE
Neisseria Gonorrhea: NEGATIVE
Trichomonas: POSITIVE — AB

## 2017-06-08 LAB — HM MAMMOGRAPHY

## 2017-10-26 ENCOUNTER — Telehealth: Payer: Self-pay | Admitting: Physician Assistant

## 2017-10-26 NOTE — Telephone Encounter (Signed)
Left message for the pt to call office back to schedule appt in order to get med refills.

## 2017-10-26 NOTE — Telephone Encounter (Signed)
Copied from CRM [id number]. Topic: Quick Communication - Rx Refill/Question >> Oct 26, 2017  9:18 AM Darletta MollLander, Lumin L wrote: Medication:  etonogestrel-ethinyl estradiol (NUVARING) 0.12-0.015 MG/24HR vaginal ring Has the patient contacted their pharmacy? No. (0 refills) (Agent: If no, request that the patient contact the pharmacy for the refill.) Preferred Pharmacy (with phone number or street name): [facility] [phone] - [place], [place] - [address] [facility] [address] Agent: Please be advised that RX refills may take up to 3 business days. We ask that you follow-up with your pharmacy.

## 2017-12-07 ENCOUNTER — Other Ambulatory Visit: Payer: Self-pay | Admitting: Physician Assistant

## 2017-12-07 DIAGNOSIS — Z3044 Encounter for surveillance of vaginal ring hormonal contraceptive device: Secondary | ICD-10-CM

## 2017-12-07 NOTE — Telephone Encounter (Signed)
Called pt to let her know that her refill had been sent. She was a little upset at the timing of the refill. Stating that she called in at the beginning of the year for the refill.  I did let her know that she would need to make an appt before any refills after this were granted.

## 2018-01-10 ENCOUNTER — Encounter: Payer: Self-pay | Admitting: Physician Assistant

## 2018-01-15 ENCOUNTER — Telehealth: Payer: Self-pay | Admitting: Physician Assistant

## 2018-01-15 NOTE — Telephone Encounter (Signed)
Left message for pt. To call and schedule an office visit before she can have refills on meds.

## 2018-01-15 NOTE — Telephone Encounter (Signed)
[facility] called and [facility] to Diane, [facility], she says she needs a supervising physician, I advised Dr. Nilda SimmerKristi Smith. She also says "the prescriptions has the patient needs and office visit for further refills, but the prescription they have that was sent on 12/07/17 is for a year. Does the provider want to refill for a year? We will hold this prescription until we hear back from you with clarification on the refills." I advised this would be sent to the provider for clarification.

## 2018-01-15 NOTE — Telephone Encounter (Signed)
Patient called, left VM to call the office to schedule an appointment in order to get medications refilled.

## 2018-01-15 NOTE — Telephone Encounter (Signed)
Copied from CRM [id number]. Topic: Quick Communication - Rx Refill/Question >> Jan 15, 2018 12:27 PM Alexander BergeronBarksdale, Harvey B wrote: Medication: etonogestrel-ethinyl estradiol (NUVARING) 0.12-0.015 MG/24HR vaginal ring [[id number]][[id number]]  Has the patient contacted their pharmacy? Yes.   (Agent: If no, request that the patient contact the pharmacy for the refill.) Preferred Pharmacy (with phone number [facility]  Agent: Please be advised that RX refills may take up to 3 business days. We ask that you follow-up with your pharmacy.  Pharmacy is stating the physician credentials is not listed on the Rx and it needs to be, contact pharmacy and pt to notify of status

## 2018-01-16 NOTE — Telephone Encounter (Signed)
Phone call to [facility], spoke with Alise. Prescription clarified.

## 2018-01-28 ENCOUNTER — Other Ambulatory Visit: Payer: Self-pay | Admitting: Physician Assistant

## 2018-01-28 ENCOUNTER — Telehealth: Payer: Self-pay | Admitting: Physician Assistant

## 2018-01-28 DIAGNOSIS — N6320 Unspecified lump in the left breast, unspecified quadrant: Secondary | ICD-10-CM

## 2018-01-28 NOTE — Telephone Encounter (Signed)
I have signed the orders in [facility].

## 2018-01-28 NOTE — Telephone Encounter (Signed)
[facility]aron [facility] called to let us know pt was seen by them today and the radiologist recommendation was for pt to have a U/S dilated left breast biopsy. The pt is scheduled at [facility] on 01/[facility]/18 to have this done. They are needing an order placed for this by Chelle for this appt. It looks like the order may already be in and that is how they scheduled, yet needs a co-signature from provider. Jasmine DecemberSharon was unsure [facility] to [facility] not allowing her to see this. Please advise. She is also faxing over pt's results from imaging today in case it is unavailable for provider via Epic. Thanks!

## 2018-01-29 ENCOUNTER — Encounter: Payer: Self-pay | Admitting: Physician Assistant

## 2018-01-29 DIAGNOSIS — N6320 Unspecified lump in the left breast, unspecified quadrant: Secondary | ICD-10-CM | POA: Insufficient documentation

## 2018-01-29 NOTE — Telephone Encounter (Signed)
Thank you :)

## 2018-01-31 ENCOUNTER — Other Ambulatory Visit: Payer: Self-pay

## 2018-02-26 ENCOUNTER — Telehealth: Payer: Self-pay | Admitting: Physician Assistant

## 2018-02-26 NOTE — Telephone Encounter (Signed)
Please contact this patient. Who is managing her mammogram results?

## 2018-02-27 ENCOUNTER — Telehealth: Payer: Self-pay | Admitting: *Deleted

## 2018-02-27 NOTE — Telephone Encounter (Signed)
Left detailed message  Per Chelle,  Patient should schedule appointment for biopsy and they should be able to give her instructions on a note to give to work on restrictions after biopsy.  Then Chelle would like her to make an appointment here on the day after her biopsy to be evaluated for additional work restrictions.

## 2018-02-27 NOTE — Telephone Encounter (Signed)
Left message for patient to call back  See below note from [facility]

## 2018-02-27 NOTE — Telephone Encounter (Signed)
Lm for patient to call back  crm in

## 2018-04-12 ENCOUNTER — Encounter: Payer: 59 | Admitting: Family Medicine

## 2018-06-27 IMAGING — DX DG KNEE COMPLETE 4+V*L*
4 series · 4 of 4 positions shown · non-contrast
Comparison: No recent prior .

CLINICAL DATA: Medial left knee pain.  No known injury.

EXAM:
LEFT KNEE - COMPLETE 4+ VIEW

[knee ap]
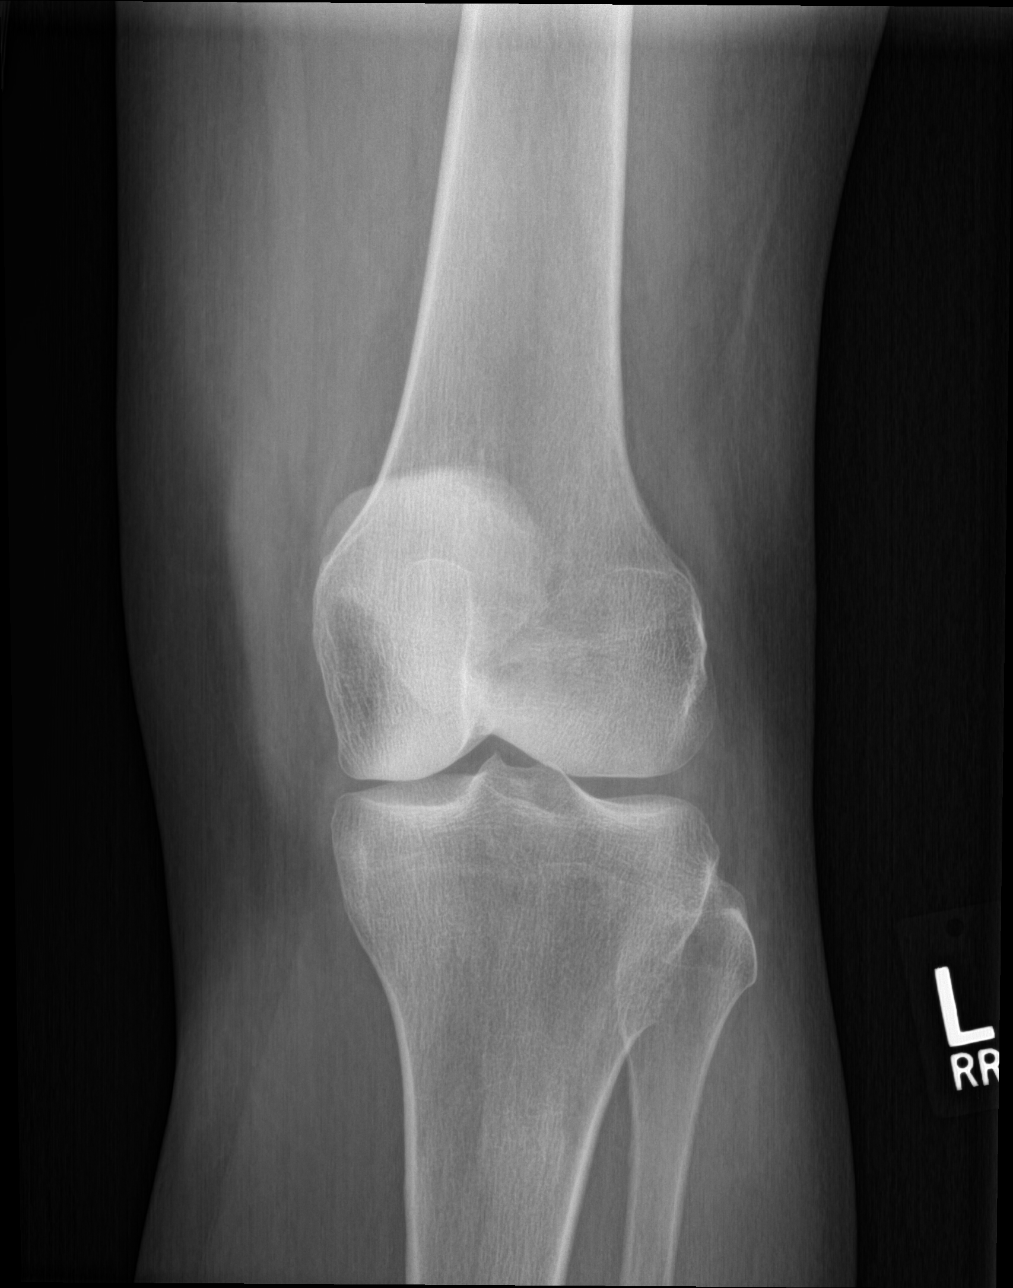

[knee obl (1 of 2)]
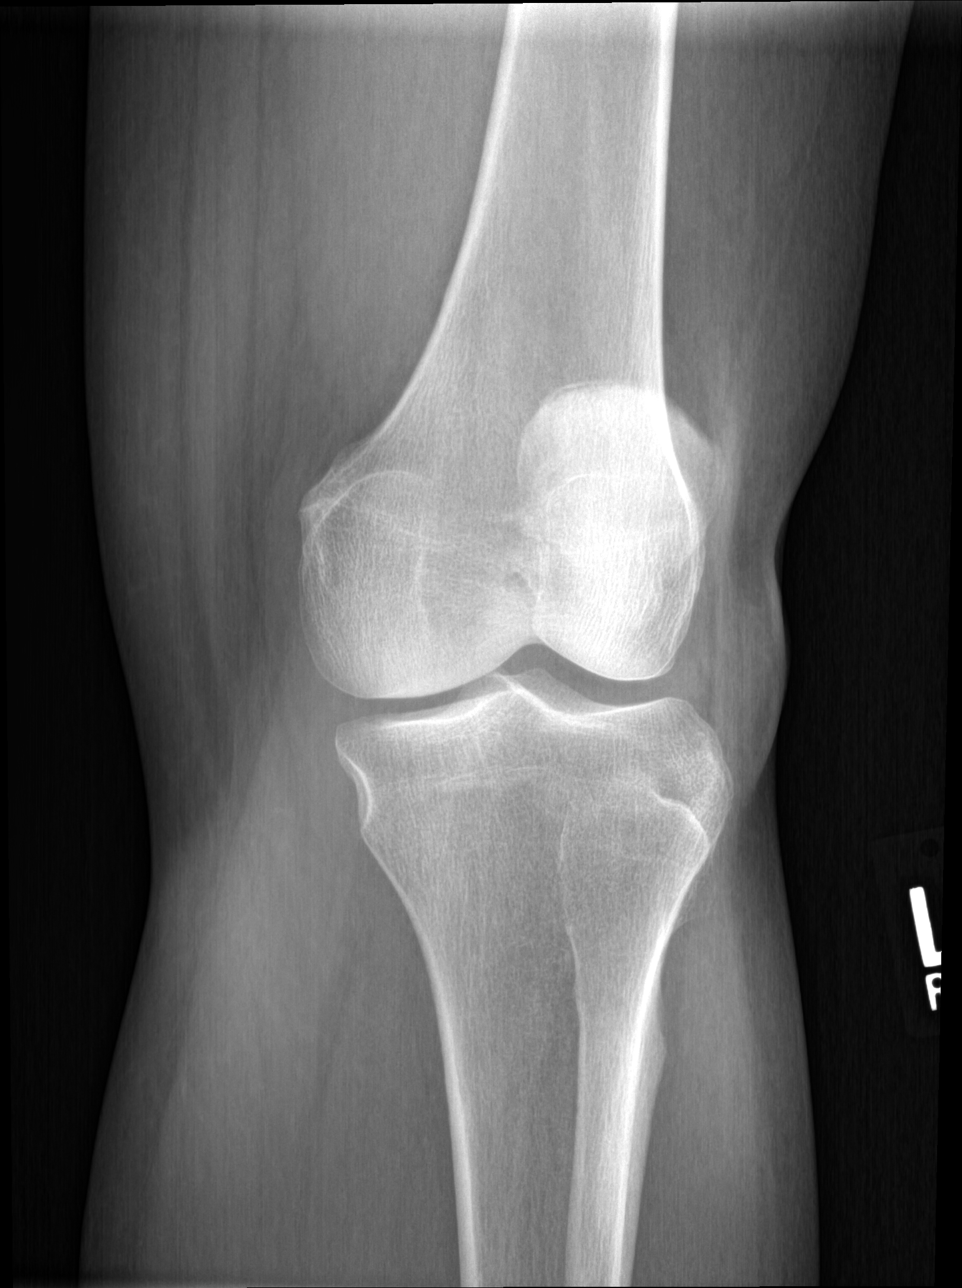

[knee obl (2 of 2)]
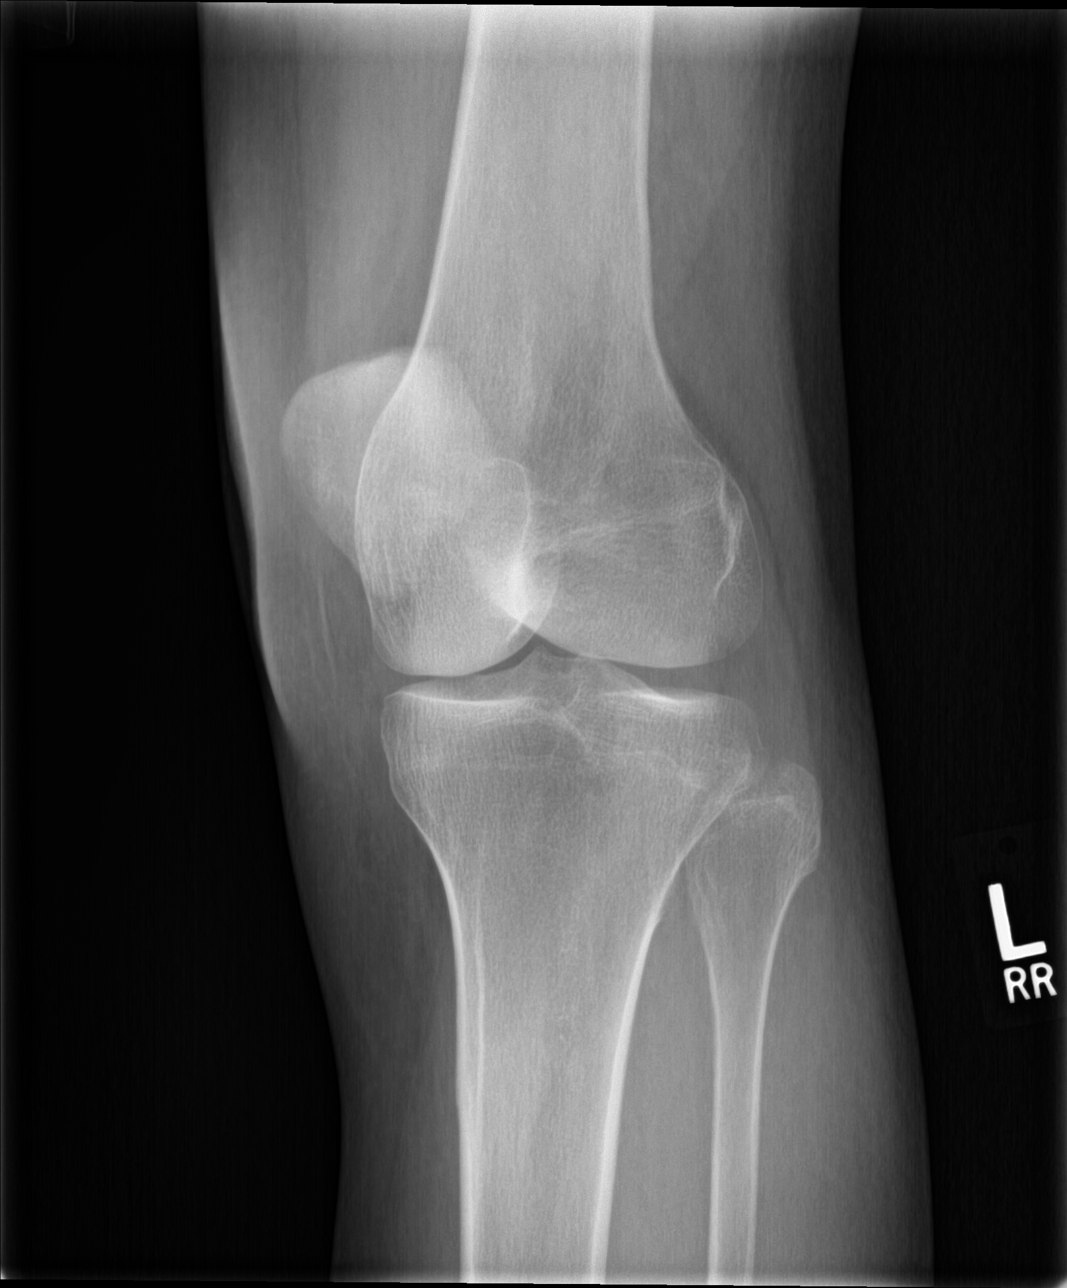

[knee lat]
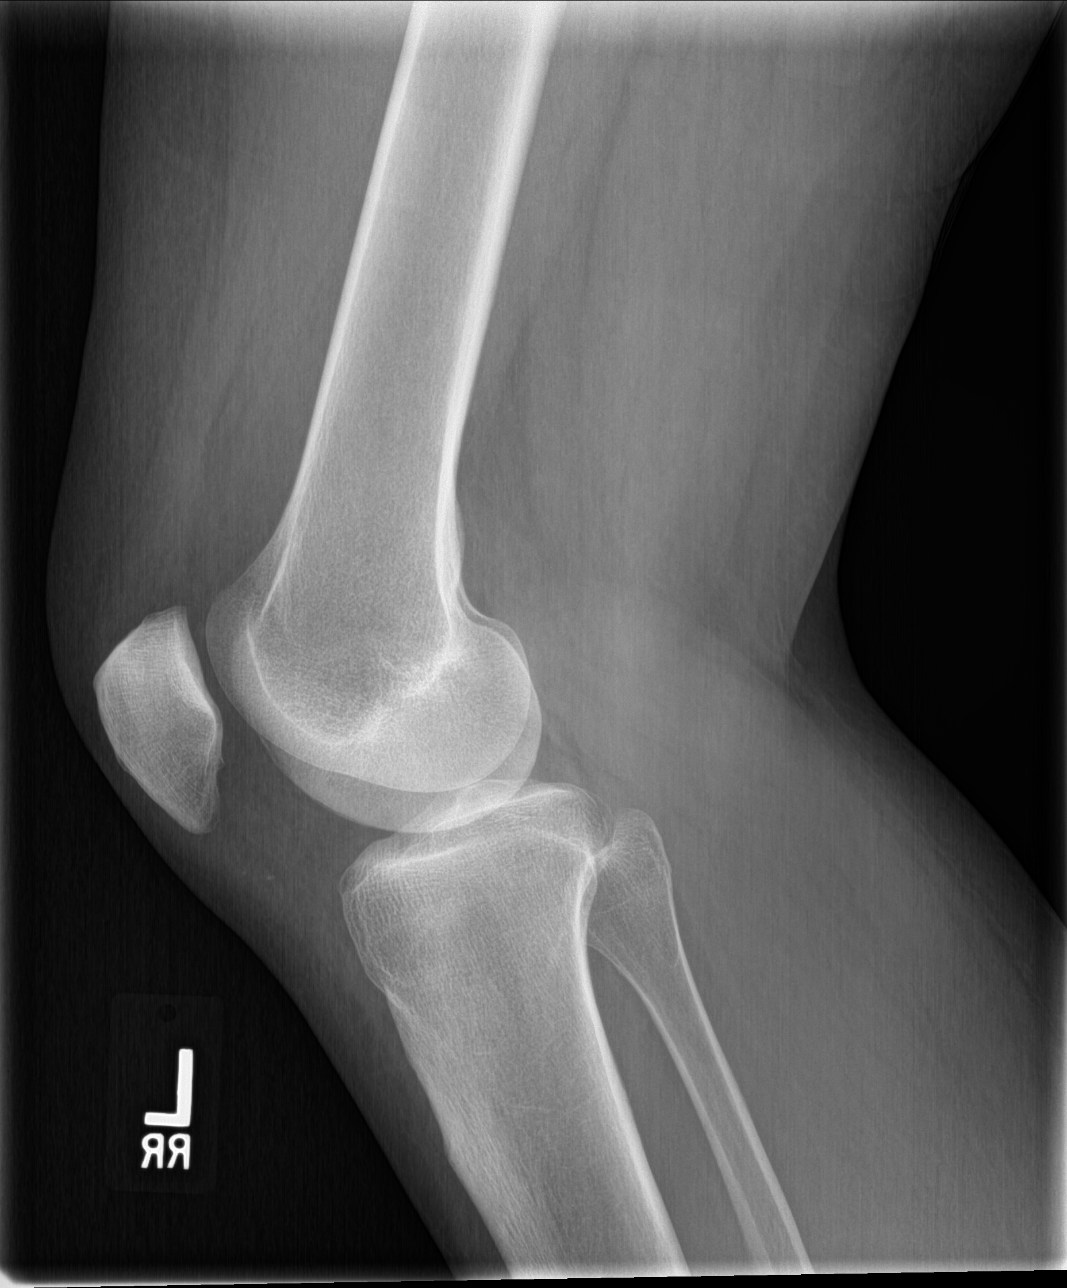

[4 of 4 positions shown; findings below may reference images not displayed]

FINDINGS: No acute bony or joint abnormality identified. No evidence of
fracture or dislocation. No focal abnormality.
IMPRESSION: No acute abnormality.

## 2019-05-09 ENCOUNTER — Telehealth (HOSPITAL_COMMUNITY): Payer: Self-pay | Admitting: *Deleted

## 2019-05-09 NOTE — Telephone Encounter (Signed)
Attempted to call patient again to schedule appointment with [facility]. No one answered phone and unable to leave a message due to patients voicemail box full.

## 2019-05-09 NOTE — Telephone Encounter (Signed)
Attempted to call patient to schedule appointment with [facility]. No one answered phone and went to voicemail. Patients voicemail box is full and unable to leave a message. Will try back later.

## 2019-05-13 ENCOUNTER — Other Ambulatory Visit: Payer: Self-pay

## 2019-05-13 ENCOUNTER — Ambulatory Visit (INDEPENDENT_AMBULATORY_CARE_PROVIDER_SITE_OTHER): Payer: BC Managed Care – PPO | Admitting: Primary Care

## 2019-05-13 DIAGNOSIS — L819 Disorder of pigmentation, unspecified: Secondary | ICD-10-CM | POA: Diagnosis not present

## 2019-05-13 DIAGNOSIS — Z7689 Persons encountering health services in other specified circumstances: Secondary | ICD-10-CM

## 2019-05-13 NOTE — Progress Notes (Signed)
Virtual Visit via Telephone Note  I connected with Tracy Montoya on 05/13/19 at  1:50 PM EDT by telephone and verified that I am speaking with the correct person using two identifiers.   I discussed the limitations, risks, security and privacy concerns of performing an evaluation and management service by telephone and the availability of in person appointments. I also discussed with the patient that there may be a patient responsible charge related to this service. The patient expressed understanding and agreed to proceed.   History of Present Illness: [name] is having a tele visit for establishment of care. Her concerns are discoloration on her labia. Due to the nature of the visit this will be examine on in person visit.    Past Medical History:  Diagnosis Date  . Allergy    Observations/Objective: Review of Systems  Skin:       Discoloration on labia  All other systems reviewed and are negative.  Assessment and Plan: Tracy Montoya was seen today for new patient (initial visit).  Diagnoses and all orders for this visit:  Encounter to establish care [name] has not had primary care in over 2 years and is establishing  care with a new provider.    Color change in pigmented skin lesion deferred to in person visit    Follow Up Instructions:    I discussed the assessment and treatment plan with the patient. The patient was provided an opportunity to ask questions and all were answered. The patient agreed with the plan and demonstrated an understanding of the instructions.   The patient was advised to call back or seek an in-person evaluation if the symptoms worsen or if the condition fails to improve as anticipated.  I provided 15 minutes of non-face-to-face time during this encounter.   Kerin Perna, NP

## 2019-05-14 ENCOUNTER — Ambulatory Visit (INDEPENDENT_AMBULATORY_CARE_PROVIDER_SITE_OTHER): Payer: BC Managed Care – PPO | Admitting: Primary Care

## 2019-05-14 ENCOUNTER — Other Ambulatory Visit (HOSPITAL_COMMUNITY)
Admission: RE | Admit: 2019-05-14 | Discharge: 2019-05-14 | Disposition: A | Discharge status: Home / Self Care | Payer: Self-pay | Source: Ambulatory Visit | Attending: Primary Care | Admitting: Primary Care

## 2019-05-14 ENCOUNTER — Encounter (INDEPENDENT_AMBULATORY_CARE_PROVIDER_SITE_OTHER): Payer: Self-pay | Admitting: Primary Care

## 2019-05-14 VITALS — BP 110/74 | HR 77 | Temp 97.6°F | Ht 77.0 in | Wt 197.8 lb

## 2019-05-14 DIAGNOSIS — Z124 Encounter for screening for malignant neoplasm of cervix: Secondary | ICD-10-CM | POA: Insufficient documentation

## 2019-05-14 DIAGNOSIS — Z0000 Encounter for general adult medical examination without abnormal findings: Secondary | ICD-10-CM | POA: Diagnosis not present

## 2019-05-14 DIAGNOSIS — Z13220 Encounter for screening for lipoid disorders: Secondary | ICD-10-CM

## 2019-05-14 DIAGNOSIS — Z114 Encounter for screening for human immunodeficiency virus [HIV]: Secondary | ICD-10-CM

## 2019-05-14 NOTE — Patient Instructions (Signed)

## 2019-05-16 ENCOUNTER — Telehealth: Payer: Self-pay | Admitting: Family Medicine

## 2019-05-16 NOTE — Telephone Encounter (Signed)
Called pt in regards to her concern of chart access. VM box full and could not leave msg.   Resolution: Her concern was escalated to [facility]. She may contact the patient in regards to restricting her chart. She restricts chart based on the information given to her.  If the patient suspects her chart is being viewed for reasons other than treatment, she may call the Compliance Hotline at [phone] or toll free number [phone]

## 2019-05-22 ENCOUNTER — Other Ambulatory Visit (INDEPENDENT_AMBULATORY_CARE_PROVIDER_SITE_OTHER): Payer: Self-pay | Admitting: Primary Care

## 2019-05-22 DIAGNOSIS — R87611 Atypical squamous cells cannot exclude high grade squamous intraepithelial lesion on cytologic smear of cervix (ASC-H): Secondary | ICD-10-CM

## 2019-05-22 LAB — CERVICOVAGINAL ANCILLARY ONLY
Bacterial vaginitis: NEGATIVE
Candida vaginitis: NEGATIVE
Chlamydia: NEGATIVE
Neisseria Gonorrhea: NEGATIVE
Trichomonas: NEGATIVE

## 2019-05-22 LAB — CYTOLOGY - PAP
Diagnosis: UNDETERMINED — AB
HPV: DETECTED — AB

## 2019-05-23 ENCOUNTER — Telehealth (INDEPENDENT_AMBULATORY_CARE_PROVIDER_SITE_OTHER): Payer: Self-pay

## 2019-05-23 NOTE — Telephone Encounter (Signed)
Name and date of birth verified. Patient informed that pap was abnormal with atypical cells. Referral placed to gyn. Nat Christen, CMA

## 2019-05-23 NOTE — Telephone Encounter (Signed)
-----   Message from Kerin Perna, NP sent at 05/22/2019  4:17 PM EDT ----- Abnormal pap with atypical squamous cell referred to GYN

## 2019-05-29 ENCOUNTER — Other Ambulatory Visit (HOSPITAL_COMMUNITY): Payer: Self-pay | Admitting: *Deleted

## 2019-05-29 DIAGNOSIS — N6320 Unspecified lump in the left breast, unspecified quadrant: Secondary | ICD-10-CM

## 2019-06-08 NOTE — Progress Notes (Signed)
New Patient Office Visit  Subjective:  Patient ID: Tracy Montoya, female    DOB: March 30, 1971  Age: 48 y.o. MRN: [id number]  CC:  Chief Complaint  Patient presents with  . Gynecologic Exam    HPI Tracy Montoya presents for establishment of care and requesting GYN visit.  Past Medical History:  Diagnosis Date  . Allergy     History reviewed. No pertinent surgical history.  Family History  Problem Relation Age of Onset  . Cancer Maternal Grandfather     Social History   Socioeconomic History  . Marital status: Single    Spouse name: Not on file  . Number of children: 1  . Years of education: Associate's Degree  . Highest education level: Not on file  Occupational History  . Occupation: Shipping    Comment: [name]  Social Needs  . Financial resource strain: Not on file  . Food insecurity    Worry: Not on file    Inability: Not on file  . Transportation needs    Medical: Not on file    Non-medical: Not on file  Tobacco Use  . Smoking status: Never Smoker  . Smokeless tobacco: Never Used  Substance and Sexual Activity  . Alcohol use: No  . Drug use: No  . Sexual activity: Yes    Partners: Male    Birth control/protection: Other-see comments    Comment: NuvaRing  Lifestyle  . Physical activity    Days per week: Not on file    Minutes per session: Not on file  . Stress: Not on file  Relationships  . Social Musicianconnections    Talks on phone: Not on file    Gets together: Not on file    Attends religious service: Not on file    Active member of club or organization: Not on file    Attends meetings of clubs or organizations: Not on file    Relationship status: Not on file  . Intimate partner violence    Fear of current or ex partner: Not on file    Emotionally abused: Not on file    Physically abused: Not on file    Forced sexual activity: Not on file  Other Topics Concern  . Not on file  Social History Narrative   Lives with her adult daughter.   Attended [employer]. Obtained GED and then Associate's Degree at [employer].    ROS Review of Systems  All other systems reviewed and are negative.   Objective:   Today's Vitals: BP 110/74 (BP Location: Left Arm, Patient Position: Sitting, Cuff Size: Normal)   Pulse 77   Temp 97.6 F (36.4 C) (Tympanic)   Ht 6\' 5"  (1.956 m)   Wt 197 lb 12.8 oz (89.7 kg)   LMP 04/26/2019 (Exact Date)   SpO2 99%   BMI 23.46 kg/m   Physical Exam  CONSTITUTIONAL: Well-developed, well-nourished female in no acute distress.  HENT:  Normocephalic, atraumatic, External right and left ear normal. Oropharynx is clear and moist EYES: Conjunctivae and EOM are normal. Pupils are equal, round, and reactive to light. No scleral icterus.  NECK: Normal range of motion, supple, no masses.  Normal thyroid.  SKIN: Skin is warm and dry. No rash noted. Not diaphoretic. No erythema. No pallor. NEUROLGIC: Alert and oriented to person, place, and time. Normal reflexes, muscle tone coordination. No cranial nerve deficit noted. PSYCHIATRIC: Normal mood and affect. Normal behavior. Normal judgment and thought content. CARDIOVASCULAR: Normal heart rate noted, regular rhythm  RESPIRATORY: Clear to auscultation bilaterally. Effort and breath sounds normal, no problems with respiration noted. BREASTS: Symmetric in size. No masses, skin changes, nipple drainage, or lymphadenopathy. ABDOMEN: Soft, normal bowel sounds, no distention noted.  No tenderness, rebound or guarding.  PELVIC: Normal appearing external genitalia; normal appearing vaginal mucosa and cervix.n Ovarian cyst near OS red round bulding  No abnormal discharge noted.  Pap smear obtained.  Normal uterine size, no other palpable masses, positive terine or adnexal tenderness.for MUSCULOSKELETAL: Normal range of motion. No tenderness.  No cyanosis, clubbing, or edema.  2+ distal pulses.  Assessment & Plan:   Problem List Items Addressed This Visit    None    Visit Diagnoses     Pap smear for cervical cancer screening    -  Primary   Relevant Orders   Cytology - PAP(Espy) (Completed)   Cervicovaginal ancillary only (Completed)   Encounter for screening for HIV       Relevant Orders   HIV antibody (with reflex)   Screening, lipid       Relevant Orders   Lipid Panel   Healthcare maintenance       Relevant Orders   CBC with Differential   Comprehensive metabolic panel      No outpatient encounter medications on file as of 05/14/2019.   No facility-administered encounter medications on file as of 05/14/2019.     Follow-up: Return for schedule lab visit for fasting labs .   Kerin Perna, NP

## 2019-06-26 ENCOUNTER — Ambulatory Visit
Admission: RE | Admit: 2019-06-26 | Discharge: 2019-06-26 | Disposition: A | Discharge status: Home / Self Care | Payer: No Typology Code available for payment source | Source: Ambulatory Visit | Attending: Obstetrics and Gynecology | Admitting: Obstetrics and Gynecology

## 2019-06-26 ENCOUNTER — Ambulatory Visit (HOSPITAL_COMMUNITY)
Admission: RE | Admit: 2019-06-26 | Discharge: 2019-06-26 | Disposition: A | Discharge status: Home / Self Care | Payer: Self-pay | Source: Ambulatory Visit | Attending: Obstetrics and Gynecology | Admitting: Obstetrics and Gynecology

## 2019-06-26 ENCOUNTER — Encounter (HOSPITAL_COMMUNITY): Payer: Self-pay

## 2019-06-26 ENCOUNTER — Other Ambulatory Visit: Payer: Self-pay

## 2019-06-26 DIAGNOSIS — R87810 Cervical high risk human papillomavirus (HPV) DNA test positive: Secondary | ICD-10-CM

## 2019-06-26 DIAGNOSIS — R87610 Atypical squamous cells of undetermined significance on cytologic smear of cervix (ASC-US): Secondary | ICD-10-CM | POA: Insufficient documentation

## 2019-06-26 DIAGNOSIS — Z1239 Encounter for other screening for malignant neoplasm of breast: Secondary | ICD-10-CM

## 2019-06-26 DIAGNOSIS — N6320 Unspecified lump in the left breast, unspecified quadrant: Secondary | ICD-10-CM

## 2019-06-26 NOTE — Patient Instructions (Signed)
Explained breast self awareness with Tracy Montoya. Patient did not need a Pap smear today due to last Pap smear was 05/14/2019. Explained the colposcopy the recommended follow-up for her abnormal Pap smear. Referred to the [facility] for a colposcopy to follow-up for her abnormal Pap smear. Appointment scheduled for Thursday, July 10, 2019 at 0930. Referred patient to the [facility] for a diagnostic mammogram and left breast ultrasound per recommendation due to last imaging was over one year ago. Appointment scheduled for Thursday, June 26, 2019 at 1400. Patient aware of appointments and will be there. Tracy Montoya verbalized understanding.  Tracy Montoya, Arvil Chaco, RN 1:01 PM

## 2019-06-26 NOTE — Progress Notes (Signed)
Patient referred to [facility] by [facility] due to having an abnormal Pap smear 05/14/2019 that a colposcopy is recommended for follow-up.   Patient had a diagnostic mammogram completed 01/28/2018 that a left breast biopsy was recommended for follow-up. Patient did not follow-up.  Pap Smear: Pap smear not completed today. Last Pap smear was 05/14/2019 at [facility] and ASCUS with positive HPV. Referred to the [facility] for a colposcopy to follow-up for her abnormal Pap smear. Appointment scheduled for Thursday, July 10, 2019 at 0930. Per patient has a history of an abnormal Pap smear when she was in her early 20's but did not follow-up due to was pregnant. Last Pap smear result is in Epic.  Physical exam: Breasts Breasts symmetrical. No skin abnormalities bilateral breasts. No nipple retraction bilateral breasts. No nipple discharge bilateral breasts. No lymphadenopathy. No lumps palpated bilateral breasts. No complaints of pain or tenderness on exam. Referred patient to the [facility] for a diagnostic mammogram and left breast ultrasound per recommendation due to last imaging was over one year ago. Appointment scheduled for Thursday, June 26, 2019 at 1400.        Pelvic/Bimanual No Pap smear completed today since last Pap smear and HPV typing was 05/14/2019. Pap smear not indicated per [facility] guidelines.   Smoking History: Patient has never smoked.  Patient Navigation: Patient education provided. Access to services provided for patient through [facility] program.   Breast and Cervical Cancer Risk Assessment: Patient has no family history of breast cancer, known genetic mutations, or radiation treatment to the chest before age 86. Patient has no history of cervical dysplasia, immunocompromised, or DES exposure in-utero.  Risk Assessment    Risk Scores      06/26/2019   Last edited by: Armond Hang, LPN   5-year risk:  0.7 %   Lifetime risk: 7.1 %

## 2019-06-30 ENCOUNTER — Encounter (HOSPITAL_COMMUNITY): Payer: Self-pay | Admitting: *Deleted

## 2019-07-09 ENCOUNTER — Telehealth: Payer: Self-pay | Admitting: Obstetrics and Gynecology

## 2019-07-09 NOTE — Telephone Encounter (Signed)
Spoke to patient about her appointment on 10/1 @ 9:35. Patient instructed to wear a face mask for the entire appointment and no visitors are allowed with her during the visit. Patient screened for covid symptoms and denied having any

## 2019-07-10 ENCOUNTER — Other Ambulatory Visit: Payer: Self-pay

## 2019-07-10 ENCOUNTER — Other Ambulatory Visit (HOSPITAL_COMMUNITY)
Admission: RE | Admit: 2019-07-10 | Discharge: 2019-07-10 | Disposition: A | Discharge status: Home / Self Care | Payer: No Typology Code available for payment source | Source: Ambulatory Visit | Attending: Obstetrics and Gynecology | Admitting: Obstetrics and Gynecology

## 2019-07-10 ENCOUNTER — Encounter: Payer: Self-pay | Admitting: Obstetrics and Gynecology

## 2019-07-10 ENCOUNTER — Ambulatory Visit (INDEPENDENT_AMBULATORY_CARE_PROVIDER_SITE_OTHER): Payer: BC Managed Care – PPO | Admitting: Obstetrics and Gynecology

## 2019-07-10 VITALS — BP 121/78 | HR 65 | Temp 98.2°F | Ht 73.0 in | Wt 202.0 lb

## 2019-07-10 DIAGNOSIS — R87810 Cervical high risk human papillomavirus (HPV) DNA test positive: Secondary | ICD-10-CM | POA: Diagnosis not present

## 2019-07-10 DIAGNOSIS — R87610 Atypical squamous cells of undetermined significance on cytologic smear of cervix (ASC-US): Secondary | ICD-10-CM

## 2019-07-10 DIAGNOSIS — N870 Mild cervical dysplasia: Secondary | ICD-10-CM | POA: Diagnosis not present

## 2019-07-10 LAB — POCT PREGNANCY, URINE: Preg Test, Ur: NEGATIVE

## 2019-07-10 NOTE — Progress Notes (Signed)
Colposcopy Procedure Note  Tracy Montoya is a 48 y.o. G1P1 here for colposcopy.  Indications:  Pap 05/2019: ASCUS, +hrHPV  Procedure Details  LMP 06/17/19; UPT negative.    The risks (including infection, bleeding, pain) and benefits of the procedure were explained to the patient and written informed consent was obtained.  The patient was placed in the dorsal lithotomy position. A Graves was speculum inserted in the vagina, and the cervix was visualized.  The cervix was stained with acetic acid and visualized using the colposcope under magnification as well as with a green filter. Findings as below. Cervical biopsies were taken at 2, 6, 9, 12. Endocervical curettage then performed in all four quadrants. Small amount of bleeding noted that improved with pressure. Patient tolerating procedure well.  Findings: acetowhite at 12 & 2, glandular tissue @ 9 o'clock, friable tissue on anterior portion of os  Impression: low grade  Adequate: yes  Specimens: biopsy at 12, 2, 6, 9 and ECC  Condition: Stable  Complications: None  Plan: The patient was advised to call for any fever or for prolonged or severe pain or bleeding. She was advised to use OTC analgesics as needed for mild to moderate pain. She was advised to avoid vaginal intercourse for 48 hours or until the bleeding has completely stopped.  Will base further management on results of biopsy.   Feliz Beam, M.D. Attending [facility] [employer] [facility] farm manager)

## 2019-07-11 ENCOUNTER — Telehealth: Payer: Self-pay | Admitting: Obstetrics and Gynecology

## 2019-07-11 NOTE — Telephone Encounter (Signed)
Pt spoke with front office requesting a rx from Dr. Rosana Hoes.   Reviewed note from pt's appt with Dr. Rosana Hoes yesterday and her recommendation was for the pt to take over the counter analgesics for pain.   Attempted to contact pt; voice mail box was full and I was unable to leave a message. Pt declined MyChart on 06/26/19.

## 2019-07-11 NOTE — Telephone Encounter (Signed)
Patient is requesting a Rx. Dr. Rosana Hoes said she could have on her visit yesterday. Call ASAP, does not want to wait till Monday.

## 2019-07-14 ENCOUNTER — Telehealth (INDEPENDENT_AMBULATORY_CARE_PROVIDER_SITE_OTHER): Payer: Self-pay | Admitting: Family Medicine

## 2019-07-14 DIAGNOSIS — K644 Residual hemorrhoidal skin tags: Secondary | ICD-10-CM

## 2019-07-14 LAB — SURGICAL PATHOLOGY

## 2019-07-14 NOTE — Telephone Encounter (Signed)
The patient called in stating she would like medication for an hemorrhoid. She stated she has been trying to call since Thursday but kept calling after we closed. She stated after hours informed her someone would call back by Friday however she hasn't got a call. She visits the [facility] on [address].

## 2019-07-14 NOTE — Telephone Encounter (Signed)
Pt reports she has hemmorrhoids and that she has tried the OTC Preparation H that is not helping. Reviewed with pt that she needs to reach out to her PCP for treatment. She reports she does not have on and hung up.

## 2019-07-16 ENCOUNTER — Telehealth: Payer: Self-pay | Admitting: General Practice

## 2019-07-16 NOTE — Telephone Encounter (Signed)
-----   Message from Sloan Leiter, MD sent at 07/15/2019  3:17 PM EDT ----- Please call and let patient know she had a low grade result on her pap, needs repeat pap 1 year from last pap

## 2019-07-16 NOTE — Telephone Encounter (Signed)
Called patient, no answer- unable to leave message due to voicemail box being full. Will send letter.

## 2019-08-14 ENCOUNTER — Encounter: Payer: Self-pay | Admitting: Obstetrics and Gynecology

## 2019-08-14 ENCOUNTER — Telehealth: Payer: Self-pay | Admitting: Obstetrics and Gynecology

## 2019-08-14 ENCOUNTER — Other Ambulatory Visit: Payer: Self-pay

## 2019-08-14 ENCOUNTER — Other Ambulatory Visit (HOSPITAL_COMMUNITY)
Admission: RE | Admit: 2019-08-14 | Discharge: 2019-08-14 | Disposition: A | Discharge status: Home / Self Care | Payer: BC Managed Care – PPO | Source: Ambulatory Visit | Attending: Obstetrics and Gynecology | Admitting: Obstetrics and Gynecology

## 2019-08-14 ENCOUNTER — Ambulatory Visit (INDEPENDENT_AMBULATORY_CARE_PROVIDER_SITE_OTHER): Payer: BC Managed Care – PPO | Admitting: Obstetrics and Gynecology

## 2019-08-14 VITALS — BP 131/86 | HR 80 | Ht 73.0 in | Wt 207.0 lb

## 2019-08-14 DIAGNOSIS — K640 First degree hemorrhoids: Secondary | ICD-10-CM

## 2019-08-14 DIAGNOSIS — N9089 Other specified noninflammatory disorders of vulva and perineum: Secondary | ICD-10-CM | POA: Insufficient documentation

## 2019-08-14 MED ORDER — HYDROCORTISONE (PERIANAL) 2.5 % EX CREA: TOPICAL_CREAM | Freq: Two times a day (BID) | CUTANEOUS | 2 refills | Status: DC

## 2019-08-14 MED ORDER — CLOBETASOL PROPIONATE 0.05 % EX CREA: 1.0000 "application " | TOPICAL_CREAM | Freq: Two times a day (BID) | CUTANEOUS | 2 refills | Status: DC

## 2019-08-14 NOTE — Telephone Encounter (Signed)
The patient called in regards to the AVS. She stated it was missing several pages. She was told she would have the information regarding her diet and she was also prescribed medications that were not on the check out. Informed the patient of the mychart app being inactive. Offered the patient a link to enroll. The patient denied access. She stated she does not want anything to do with mychart. Offered the patient the mychart AVS via the mail to avoid travel back to our office. Verified with Dr. Rosana Hoes and Mrs. Pam (CMA) the information that should be included in the AVS. The chart was updated with information and re-printed. The patient was also advised of the medications not being listed on the new AVS however once the MD completes the current appointment we will call back with a confirmation of what was sent. The patient was informed the medications were sent to the pharmacy and will be ready by 1. The patient then asked what pharmacy we sent the mediations to? She also proceeded to say we are treating her unfairly because she does not have insurance and if she had insurance all of her information would have been together at the visit and sent correctly. I apologized to the patient and advised we treat all our patients the same, we are definitely doing everything we can to resolve this issue for you and provide you with the correct answer. Informed the patient I would give her call back once Dr. Rosana Hoes is complete with the appointment with the information of the medication that were sent. The patient became upset and stated I will not call her back she will end up calling back herself because we don't call patients back without insurance.  The patient was called back 15 minutes later, however she did not pick up the phone. Received a message the mailbox is full and cant received messages at this time.

## 2019-08-14 NOTE — Progress Notes (Signed)
   GYNECOLOGY OFFICE FOLLOW UP NOTE  History:  48 y.o. G1P1 here today for follow up for vulvar biopsy. Has itching in left vulva, has been going on a while. Noted on colposcopy that area was hypopigmented and recommended biopsy, to which she was agreeable. Also complaining of hemorrhoids.    Past Medical History:  Diagnosis Date  . Allergy     History reviewed. No pertinent surgical history.   Current Outpatient Medications:  .  clobetasol cream (TEMOVATE) 8.32 %, Apply 1 application topically 2 (two) times daily. Wait until 1 week after biopsy to start. Use twice daily on area for 2 weeks, then switch to once daily., Disp: 30 g, Rfl: 2 .  hydrocortisone (ANUSOL-HC) 2.5 % rectal cream, Place rectally 2 (two) times daily., Disp: 30 g, Rfl: 2  The following portions of the patient's history were reviewed and updated as appropriate: allergies, current medications, past family history, past medical history, past social history, past surgical history and problem list.   Review of Systems:  Pertinent items noted in HPI and remainder of comprehensive ROS otherwise negative.   Objective:  Physical Exam BP 131/86   Pulse 80   Ht 6\' 1"  (1.854 m)   Wt 207 lb (93.9 kg)   BMI 27.31 kg/m  CONSTITUTIONAL: Well-developed, well-nourished female in no acute distress.  HENT:  Normocephalic, atraumatic. External right and left ear normal. Oropharynx is clear and moist EYES: Conjunctivae and EOM are normal. Pupils are equal, round, and reactive to light. No scleral icterus.  NECK: Normal range of motion, supple, no masses SKIN: Skin is warm and dry. No rash noted. Not diaphoretic. No erythema. No pallor. NEUROLOGIC: Alert and oriented to person, place, and time. Normal reflexes, muscle tone coordination. No cranial nerve deficit noted. PSYCHIATRIC: Normal mood and affect. Normal behavior. Normal judgment and thought content. CARDIOVASCULAR: Normal heart rate noted RESPIRATORY: Effort normal, no  problems with respiration noted ABDOMEN: Soft, no distention noted.   PELVIC: Normal appearing external genitalia with 2 cm oval hypopigmented area on left labia majora at level of clitoris. No other lesions noted MUSCULOSKELETAL: Normal range of motion. No edema noted.  Exam done with chaperone present.  Labs and Imaging No results found.  Assessment & Plan:   1. Vulvar lesion Reviewed recommendation for biopsy, to rule out malignancy. 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 Grade I hemorrhoids anusol sent to pharmacy  Routine preventative health maintenance measures emphasized. Please refer to After Visit Summary for other counseling recommendations.   Return in about 3 months (around 11/14/2019) for Followup.  Total face-to-face time with patient: 20 minutes. Over 50% of encounter was spent on counseling and coordination of care.  Feliz Beam, M.D. Attending Center for [employer] [facility] farm manager)

## 2019-08-14 NOTE — Progress Notes (Signed)
   VULVAR BIOPSY NOTE  Tracy Montoya [id number]  08/14/2019  Indication for biopsy: hypopigmentation, itching  The indications for vulvar biopsy were reviewed. Risks of the biopsy including pain, bleeding, infection, inadequate specimen, and need for additional procedures were discussed. The patient stated understanding and agreed to undergo procedure today. Consent was signed, and an adequate time out performed.   The patient's vulva was prepped with Betadine. 1% lidocaine was injected into the left labia majora at the level of the hypopigmented lesion in 2 years. A 4-mm punch biopsy was done at 12 o'clock and 6 o'clock of the lesion, including normal appearing tissue, biopsy tissue was picked up with sterile forceps and sterile scissors were used to excise the lesion.  Small bleeding was noted and hemostasis was achieved using silver nitrate sticks.    The patient tolerated the procedure well.   Specimens: 12o'clock of hypopigmented lesion, 6 o'clock of hypopigmented lesion  Post-procedure instructions were given to the patient. The patient is to call with heavy bleeding, fever greater than 100.4, foul smelling vaginal discharge or other concerns. The patient will be return to clinic in two weeks for discussion of results.   Feliz Beam, M.D. Attending [place], [facility] for [employer], [place]

## 2019-08-14 NOTE — Patient Instructions (Signed)

## 2019-08-18 LAB — SURGICAL PATHOLOGY

## 2019-08-20 ENCOUNTER — Telehealth: Payer: Self-pay | Admitting: Obstetrics and Gynecology

## 2019-08-20 ENCOUNTER — Telehealth: Payer: Self-pay

## 2019-08-20 NOTE — Telephone Encounter (Signed)
Pt called stating that she has a question for Dr. Rosana Hoes regarding the application of the cream that she Rx. She wanted to know how was she going to apply cream to the area she can not see, meaning Dr. Rosana Hoes was able to see spots that she couldn't & pt wants to know how can she apply the cream to that specific area. I advised just apply on labia & inside labia area. Pt did not accept that answer, wants to speak with Dr. Rosana Hoes directly. Advised will send message to her.

## 2019-08-20 NOTE — Telephone Encounter (Signed)
Called patient regarding pathology results, benign and to be treated with previously prescribed clobetasol. Answered all questions.   Feliz Beam, M.D. Attending Center for [employer] [facility] farm manager)

## 2019-08-25 ENCOUNTER — Telehealth: Payer: Self-pay | Admitting: Obstetrics and Gynecology

## 2019-08-25 NOTE — Telephone Encounter (Signed)
Returned patient call regarding application of clobetasol, no answer and unable to leave VM. Will try again tomorrow.   Feliz Beam, M.D. Attending Center for [employer] [facility] farm [facility])

## 2019-09-09 ENCOUNTER — Encounter: Payer: Self-pay | Admitting: *Deleted

## 2019-09-22 ENCOUNTER — Telehealth (INDEPENDENT_AMBULATORY_CARE_PROVIDER_SITE_OTHER): Payer: Self-pay | Admitting: Primary Care

## 2019-10-20 NOTE — Telephone Encounter (Signed)
Opened in error

## 2019-10-29 ENCOUNTER — Telehealth: Payer: Self-pay | Admitting: Obstetrics and Gynecology

## 2019-10-29 NOTE — Telephone Encounter (Signed)
Patient is request a call back from the nurse. She has some questions for Dr Earlene Plater.

## 2019-11-03 ENCOUNTER — Telehealth (INDEPENDENT_AMBULATORY_CARE_PROVIDER_SITE_OTHER): Payer: BC Managed Care – PPO | Admitting: Obstetrics and Gynecology

## 2019-11-03 ENCOUNTER — Encounter: Payer: Self-pay | Admitting: Obstetrics and Gynecology

## 2019-11-03 ENCOUNTER — Other Ambulatory Visit: Payer: Self-pay

## 2019-11-03 DIAGNOSIS — N9089 Other specified noninflammatory disorders of vulva and perineum: Secondary | ICD-10-CM | POA: Diagnosis not present

## 2019-11-03 NOTE — Progress Notes (Signed)
Pt states that she noticed on last Wednesday 10/29/19 something that looked like a wart on her vulvar area or vaginal area.

## 2019-11-03 NOTE — Progress Notes (Signed)
    TELEHEALTH GYNECOLOGY VIRTUAL VIDEO VISIT ENCOUNTER NOTE  Provider location: Center for [employer] at [place]   I connected with Tracy Montoya on 11/03/19 at  2:35 PM EST by MyChart Video Encounter at home and verified that I am speaking with the correct person using two identifiers.   I discussed the limitations, risks, security and privacy concerns of performing an evaluation and management service virtually and the availability of in person appointments. I also discussed with the patient that there may be a patient responsible charge related to this service. The patient expressed understanding and agreed to proceed.   History:  Tracy Montoya is a 49 y.o. G1P1 female being evaluated today for a skin tag/wart she found on her left labia. Found it while shaving. This is a new spot, states it has not been there previously. The area itches, some tingling one day but did not think much of it. No bleeding. Would like to know what it is and get it treated if possible.  Using the clobetasol on her hypopigmented area which has improved her itching.   She denies any abnormal vaginal discharge, bleeding, pelvic pain or other concerns.     Past Medical History:  Diagnosis Date  . Allergy    History reviewed. No pertinent surgical history. The following portions of the patient's history were reviewed and updated as appropriate: allergies, current medications, past family history, past medical history, past social history, past surgical history and problem list.    Review of Systems:  Pertinent items noted in HPI and remainder of comprehensive ROS otherwise negative.  Physical Exam:   General:  Alert, oriented and cooperative. Patient appears to be in no acute distress.  Mental Status: Normal mood and affect. Normal behavior. Normal judgment and thought content.   Respiratory: Normal respiratory effort, no problems with respiration noted  Rest of physical exam deferred due to type of  encounter  Labs and Imaging No results found for this or any previous visit (from the past 336 hour(s)). No results found.     Assessment and Plan:   1. Vulvar lesion - Reviewed could be skin tag or wart, would only be able to tell with clinical exam and possible biopsy - she is agreeable to plan    I discussed the assessment and treatment plan with the patient. The patient was provided an opportunity to ask questions and all were answered. The patient agreed with the plan and demonstrated an understanding of the instructions.   The patient was advised to call back or seek an in-person evaluation/go to the ED if the symptoms worsen or if the condition fails to improve as anticipated.  I provided 15 minutes of face-to-face time during this encounter.   Conan Bowens, MD Center for [facility], [facility]

## 2019-11-04 ENCOUNTER — Other Ambulatory Visit: Payer: Self-pay | Admitting: General Practice

## 2019-11-08 ENCOUNTER — Encounter (HOSPITAL_COMMUNITY): Payer: Self-pay | Admitting: *Deleted

## 2019-11-08 ENCOUNTER — Other Ambulatory Visit: Payer: Self-pay

## 2019-11-08 ENCOUNTER — Ambulatory Visit (HOSPITAL_COMMUNITY)
Admission: EM | Admit: 2019-11-08 | Discharge: 2019-11-08 | Disposition: A | Discharge status: Home / Self Care | Payer: BC Managed Care – PPO | Attending: Family Medicine | Admitting: Family Medicine

## 2019-11-08 DIAGNOSIS — N9089 Other specified noninflammatory disorders of vulva and perineum: Secondary | ICD-10-CM

## 2019-11-08 HISTORY — DX: Papillomavirus as the cause of diseases classified elsewhere: B97.7

## 2019-11-08 NOTE — ED Triage Notes (Signed)
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 Over past few days, noticed some slight genital irritation; noticed a lesion near vaginal area.  Pt concerned about genital wart.

## 2019-11-08 NOTE — Discharge Instructions (Signed)
Exam is consistent with a skin tag at this time, I do not feel that this is obviously a genital wart.  Continue to follow with gynecology as needed for monitoring and for your regular PAP's.

## 2019-11-08 NOTE — ED Provider Notes (Signed)
[facility]    CSN: [id number] Arrival date & time: 11/08/19  1003      History   Chief Complaint Chief Complaint  Patient presents with  . Genital Warts    HPI Tracy Montoya is a 49 y.o. female.   Tracy Montoya presents with complaints of area to her vulva which she is concerned about. She states she is concerned that it is a genital wart. Her last pap, 05/2019 was positive for HPV, with benign findings on colposcopy. This has her more anxious, however. She noted the area on 1/20. It is slightly itching, but no pain redness or drainage. No surrounding tissue redness swelling or pain. She has a history of regions of lichen to her labia which was treated with clobetasol in the past, she feels this looks and feels different. No treatments have been tried. She is currently menstruating. No other vaginal complaints. She has an appointment with her gynecologist 2/8.     ROS per HPI, negative if not otherwise mentioned.      Past Medical History:  Diagnosis Date  . Allergy   . HPV in female     Patient Active Problem List   Diagnosis Date Noted  . Screening breast examination 06/26/2019  . Atypical squamous cell changes of undetermined significance (ASCUS) on cervical cytology with positive high risk human papilloma virus (HPV) 06/26/2019  . Left breast mass 01/29/2018  . Glaucoma 10/03/2016    History reviewed. No pertinent surgical history.  OB History    Gravida  1   Para  1   Term      Preterm      AB      Living  1     SAB      TAB      Ectopic      Multiple      Live Births               Home Medications    Prior to Admission medications   Medication Sig Start Date End Date Taking? Authorizing Provider  clobetasol cream (TEMOVATE) 0.05 % Apply 1 application topically 2 (two) times daily. Wait until 1 week after biopsy to start. Use twice daily on area for 2 weeks, then switch to once daily. 08/14/19   Conan Bowens, MD    hydrocortisone (ANUSOL-HC) 2.5 % rectal cream Place rectally 2 (two) times daily. 08/14/19   Conan Bowens, MD    Family History Family History  Problem Relation Age of Onset  . Cancer Maternal Grandfather        colon    Social History Social History   Tobacco Use  . Smoking status: Never Smoker  . Smokeless tobacco: Never Used  Substance Use Topics  . Alcohol use: Yes    Comment: occasionally  . Drug use: Never     Allergies   Patient has no known allergies.   Review of Systems Review of Systems   Physical Exam Triage Vital Signs ED Triage Vitals  Enc Vitals Group     BP 11/08/19 1014 134/81     Pulse Rate 11/08/19 1014 70     Resp 11/08/19 1014 16     Temp 11/08/19 1014 (!) 97.4 F (36.3 C)     Temp Source 11/08/19 1014 Oral     SpO2 11/08/19 1014 100 %     Weight --      Height --      Head Circumference --  Peak Flow --      Pain Score 11/08/19 1015 0     Pain Loc --      Pain Edu? --      Excl. in [place]? --    No data found.  Updated Vital Signs BP 134/81   Pulse 70   Temp (!) 97.4 F (36.3 C) (Oral)   Resp 16   LMP 11/08/2019 (Exact Date)   SpO2 100%   Visual Acuity Right Eye Distance:   Left Eye Distance:   Bilateral Distance:    Right Eye Near:   Left Eye Near:    Bilateral Near:     Physical Exam Constitutional:      General: She is not in acute distress.    Appearance: She is well-developed.  Cardiovascular:     Rate and Rhythm: Normal rate.  Pulmonary:     Effort: Pulmonary effort is normal.  Genitourinary:      Comments: Left external labial majora with approximately 3 mm in height, 2 mm in width skin toned raised skin tag; non tender; no redness, no pustule or papule; non tender; no other lesions noted Skin:    General: Skin is warm and dry.  Neurological:     Mental Status: She is alert and oriented to person, place, and time.      UC Treatments / Results  Labs (all labs ordered are listed, but only  abnormal results are displayed) Labs Reviewed - No data to display  EKG   Radiology No results found.  Procedures Procedures (including critical care time)  Medications Ordered in UC Medications - No data to display  Initial Impression / Assessment and Plan / UC Course  I have reviewed the triage vital signs and the nursing notes.  Pertinent labs & imaging results that were available during my care of the patient were reviewed by me and considered in my medical decision making (see chart for details).     Exam findings are consistent with a skin tag at this time. Discussed this at length with patient, no further treatment indicated. HPV discussed with patient as well as she is anxious about this finding on her PAP last year. Appointment with gyn 2/8 which is reassuring for any further questions or concerns she may have. Patient verbalized understanding and agreeable to plan.   Final Clinical Impressions(s) / UC Diagnoses   Final diagnoses:  Skin tag of labia     Discharge Instructions     Exam is consistent with a skin tag at this time, I do not feel that this is obviously a genital wart.  Continue to follow with gynecology as needed for monitoring and for your regular PAP's.    ED Prescriptions    None     PDMP not reviewed this encounter.   Zigmund Gottron, NP 11/08/19 1039

## 2019-11-17 ENCOUNTER — Ambulatory Visit: Payer: No Typology Code available for payment source | Admitting: Family Medicine

## 2019-11-25 ENCOUNTER — Telehealth: Payer: Self-pay

## 2019-11-25 DIAGNOSIS — N9089 Other specified noninflammatory disorders of vulva and perineum: Secondary | ICD-10-CM

## 2019-11-25 DIAGNOSIS — K640 First degree hemorrhoids: Secondary | ICD-10-CM

## 2019-11-25 NOTE — Telephone Encounter (Signed)
Pt left VM on nurse line requesting two medication refills; not clear which medications need refill based on VM.

## 2019-11-26 MED ORDER — HYDROCORTISONE (PERIANAL) 2.5 % EX CREA: TOPICAL_CREAM | Freq: Two times a day (BID) | CUTANEOUS | 0 refills | Status: DC

## 2019-11-26 MED ORDER — CLOBETASOL PROPIONATE 0.05 % EX CREA: 1.0000 "application " | TOPICAL_CREAM | Freq: Two times a day (BID) | CUTANEOUS | 2 refills | Status: DC

## 2019-11-26 NOTE — Telephone Encounter (Signed)
I called Tracy Montoya and she requests refill of Anusol HC and Clobetasol , states she forgot to ask her during her virtual visit recently. I informed her Dr. Earlene Plater is out but I will review with another provider and do not see that there will be any issues with refilling ; but if there are I will call her back. Reviewed which pharmacy to send refills to . She voices understanding.  After call I discussed with Dr. Macon Large and refills approved and sent to pharmacy. Vicky Schleich,RN

## 2019-12-02 ENCOUNTER — Ambulatory Visit (INDEPENDENT_AMBULATORY_CARE_PROVIDER_SITE_OTHER): Payer: BC Managed Care – PPO | Admitting: Family Medicine

## 2019-12-02 ENCOUNTER — Other Ambulatory Visit: Payer: Self-pay

## 2019-12-02 ENCOUNTER — Encounter: Payer: Self-pay | Admitting: Family Medicine

## 2019-12-02 ENCOUNTER — Other Ambulatory Visit (HOSPITAL_COMMUNITY)
Admission: RE | Admit: 2019-12-02 | Discharge: 2019-12-02 | Disposition: A | Discharge status: Home / Self Care | Payer: BC Managed Care – PPO | Source: Ambulatory Visit | Attending: Family Medicine | Admitting: Family Medicine

## 2019-12-02 VITALS — BP 113/73 | HR 66 | Wt 206.9 lb

## 2019-12-02 DIAGNOSIS — N843 Polyp of vulva: Secondary | ICD-10-CM | POA: Diagnosis not present

## 2019-12-02 DIAGNOSIS — N9089 Other specified noninflammatory disorders of vulva and perineum: Secondary | ICD-10-CM | POA: Insufficient documentation

## 2019-12-02 DIAGNOSIS — N904 Leukoplakia of vulva: Secondary | ICD-10-CM

## 2019-12-02 LAB — POCT PREGNANCY, URINE: Preg Test, Ur: NEGATIVE

## 2019-12-02 NOTE — Progress Notes (Signed)
   Subjective:    Patient ID: Tracy Montoya is a 49 y.o. female presenting with Follow-up  on 12/02/2019  HPI: Here for lesion on vulva. Last seen with Dr. Earlene Montoya, virtually on 01/25 and here for further assessment. Noticed bump at end of January. Has lichen sclerosis and itching related to that. Has itching where the bump is. Has not changed size. No pain. Lesion is skin colored. Also with lichen sclerosis. On clobetasol.  Review of Systems  Constitutional: Negative for chills and fever.  Respiratory: Negative for shortness of breath.   Cardiovascular: Negative for chest pain.  Gastrointestinal: Negative for abdominal pain, nausea and vomiting.  Genitourinary: Negative for dysuria.  Skin: Negative for rash.      Objective:    BP 113/73   Pulse 66   Wt 206 lb 14.4 oz (93.8 kg)   LMP 10/31/2019 (Exact Date)   BMI 27.30 kg/m  Physical Exam Constitutional:      General: She is not in acute distress.    Appearance: She is well-developed.  HENT:     Head: Normocephalic and atraumatic.  Eyes:     General: No scleral icterus. Cardiovascular:     Rate and Rhythm: Normal rate.  Pulmonary:     Effort: Pulmonary effort is normal.  Abdominal:     Palpations: Abdomen is soft.  Genitourinary:    Comments: Flesh colored skin tag noted. Left labia majora. Retained external genitalia architecture. Musculoskeletal:     Cervical back: Neck supple.  Skin:    General: Skin is warm and dry.  Neurological:     Mental Status: She is alert and oriented to person, place, and time.    Procedure: Patient identified, informed consent signed, copy in chart, time out performed.    Area cleaned with Betadine and injected with 1 cc of Lidocaine. Skin tag snipped with scissors.  Hemostasis obtained with Silver Nitrate.  Patient tolerated procedure well.      Assessment & Plan:   Problem List Items Addressed This Visit      Unprioritized   Vulvar lesion - Primary    S/p biopsy, await  results. Will send via Mychart.      Relevant Orders   Surgical pathology( Tracy Montoya/ [facility])   Lichen sclerosus et atrophicus of the vulva    On clobetasol, continue 2x/wk prn         Total face-to-face time with patient: 20 minutes. Over 50% of encounter was spent on counseling and coordination of care. Return in about 6 months (around 05/31/2020) for virtual.  Reva Bores 12/02/2019 10:35 AM

## 2019-12-02 NOTE — Assessment & Plan Note (Signed)
On clobetasol, continue 2x/wk prn

## 2019-12-02 NOTE — Assessment & Plan Note (Signed)
S/p biopsy, await results. Will send via Mychart.

## 2019-12-02 NOTE — Patient Instructions (Signed)
Lichen Sclerosus Lichen sclerosus is a skin problem. It can happen on any part of the body. It happens most often in the anal or genital areas. It can cause itching and discomfort. Treatment can help to control symptoms. It can also help prevent scarring that may lead to other problems. What are the causes? The cause of this condition is not known. It is not passed from one person to another (not contagious). What increases the risk? This condition is more likely to develop in women. It most often occurs after menopause. What are the signs or symptoms? Symptoms of this condition include:  Thin, wrinkled, white areas on the skin.  Thickened white areas on the skin.  Red and swollen patches (lesions) on the skin.  Tears or cracks in the skin.  Bruising.  Blood blisters.  Very bad itching.  Pain, itching, or burning when peeing (urinating).  Trouble pooping (constipation). How is this diagnosed? This condition may be diagnosed with a physical exam. A sample of your skin may also be removed to be looked at under a microscope (biopsy). How is this treated? This condition may be treated with:  Creams or ointments (topical steroids) that are put on the skin in the affected areas. This is the most common treatment.  Medicines that are taken by mouth.  Surgery. This is only needed if the condition is very bad and is causing problems such as scarring. Follow these instructions at home:  Take or use over-the-counter and prescription medicines only as told by your doctor.  Use creams or ointments as told by your doctor.  Do not scratch the affected areas of skin.  If you are a woman, keep the vagina as clean and dry as you can.  Clean the affected area of skin gently with water. Avoid using rough towels or toilet paper.  Keep all follow-up visits as told by your doctor. This is important. Contact a doctor if:  Your redness, swelling, or pain gets worse.  You have fluid,  blood, or pus coming from the area.  You have new red and swollen patches on your skin.  You have a fever.  You have pain during sex. Summary  Lichen sclerosus is a skin problem. It can cause itching and discomfort.  This condition is usually treated with creams or ointments that are put on the skin in the affected areas.  Use medicines only as told by your doctor.  Do not scratch the affected areas of skin.  Keep all follow-up visits as told by your doctor. This is important. This information is not intended to replace advice given to you by your health care provider. Make sure you discuss any questions you have with your health care provider. Document Revised: 02/07/2018 Document Reviewed: 02/07/2018 Elsevier Patient Education  2020 [employer].  

## 2019-12-04 ENCOUNTER — Other Ambulatory Visit: Payer: Self-pay

## 2019-12-05 LAB — SURGICAL PATHOLOGY

## 2019-12-09 ENCOUNTER — Telehealth: Payer: Self-pay

## 2019-12-09 NOTE — Telephone Encounter (Signed)
Called patient to advise of Biopsy results being Normal. Patient verbalized understanding.

## 2019-12-09 NOTE — Telephone Encounter (Signed)
-----   Message from Vivien Rota sent at 12/09/2019  1:46 PM EST ----- Thank you ----- Message ----- From: Reva Bores, MD Sent: 12/05/2019   4:12 PM EST To: [facility]  Skin tag shown on biopsy--normal--please inform pt.

## 2020-01-22 ENCOUNTER — Encounter: Payer: Self-pay | Admitting: *Deleted

## 2020-05-20 ENCOUNTER — Ambulatory Visit: Payer: No Typology Code available for payment source | Admitting: Obstetrics and Gynecology

## 2020-06-11 ENCOUNTER — Other Ambulatory Visit (HOSPITAL_COMMUNITY)
Admission: RE | Admit: 2020-06-11 | Discharge: 2020-06-11 | Disposition: A | Discharge status: Home / Self Care | Payer: BC Managed Care – PPO | Source: Ambulatory Visit | Attending: Obstetrics and Gynecology | Admitting: Obstetrics and Gynecology

## 2020-06-11 ENCOUNTER — Other Ambulatory Visit: Payer: Self-pay

## 2020-06-11 ENCOUNTER — Ambulatory Visit (INDEPENDENT_AMBULATORY_CARE_PROVIDER_SITE_OTHER): Payer: BC Managed Care – PPO | Admitting: Obstetrics and Gynecology

## 2020-06-11 ENCOUNTER — Encounter: Payer: Self-pay | Admitting: Obstetrics and Gynecology

## 2020-06-11 VITALS — BP 106/74 | HR 73 | Ht 73.0 in | Wt 200.1 lb

## 2020-06-11 DIAGNOSIS — W57XXXA Bitten or stung by nonvenomous insect and other nonvenomous arthropods, initial encounter: Secondary | ICD-10-CM

## 2020-06-11 DIAGNOSIS — Z1231 Encounter for screening mammogram for malignant neoplasm of breast: Secondary | ICD-10-CM | POA: Diagnosis not present

## 2020-06-11 DIAGNOSIS — N870 Mild cervical dysplasia: Secondary | ICD-10-CM

## 2020-06-11 DIAGNOSIS — N904 Leukoplakia of vulva: Secondary | ICD-10-CM

## 2020-06-11 DIAGNOSIS — N9089 Other specified noninflammatory disorders of vulva and perineum: Secondary | ICD-10-CM

## 2020-06-11 DIAGNOSIS — Z87898 Personal history of other specified conditions: Secondary | ICD-10-CM | POA: Diagnosis not present

## 2020-06-11 DIAGNOSIS — Z01419 Encounter for gynecological examination (general) (routine) without abnormal findings: Secondary | ICD-10-CM | POA: Diagnosis not present

## 2020-06-11 DIAGNOSIS — Z7189 Other specified counseling: Secondary | ICD-10-CM

## 2020-06-11 MED ORDER — CLOBETASOL PROPIONATE 0.05 % EX CREA: 1.0000 "application " | TOPICAL_CREAM | Freq: Two times a day (BID) | CUTANEOUS | 2 refills | Status: DC

## 2020-06-11 NOTE — Progress Notes (Signed)
GYNECOLOGY ANNUAL PREVENTATIVE CARE ENCOUNTER NOTE  Subjective:   Tracy Montoya is a 49 y.o. G1P1 female here for a annual gynecologic exam. Current complaints: none.  Here for pap. The vaginal/vulvar itching she has with lichen sclerosus has improved significantly with clobetasol cream, uses cream 1-2x per month.    Has itchy bump where she had a tick bite in May 2021. Swelling has decreased and it doesn't bother her unless she messes with it.   Denies abnormal vaginal bleeding, discharge, pelvic pain, problems with intercourse or other gynecologic concerns. Declines STI screen.   Gynecologic History Patient's last menstrual period was 05/23/2020 (within days). Contraception: NuvaRing vaginal inserts Last Pap: 05/2019. Results: ASCUS, colpo with CIN 1 07/2019 Last mammogram: 06/2019. Results: Birads 3 DEXA: has never had  Obstetric History OB History  Gravida Para Term Preterm AB Living  1 1       1   SAB TAB Ectopic Multiple Live Births               # Outcome Date GA Lbr Len/2nd Weight Sex Delivery Anes PTL Lv  1 Para      Vag-Spont       Past Medical History:  Diagnosis Date  . Allergy   . HPV in female     History reviewed. No pertinent surgical history.  No current outpatient medications on file prior to visit.   No current facility-administered medications on file prior to visit.    No Known Allergies  Social History   Socioeconomic History  . Marital status: Single    Spouse name: Not on file  . Number of children: 1  . Years of education: Associate's Degree  . Highest education level: Not on file  Occupational History  . Occupation: Shipping    Comment:  Tobacco Use  . Smoking status: Never Smoker  . Smokeless tobacco: Never Used  Vaping Use  . Vaping Use: Never used  Substance and Sexual Activity  . Alcohol use: Yes    Comment: occasionally  . Drug use: Never  . Sexual activity: Yes    Partners: Male    Birth  control/protection: Inserts  Other Topics Concern  . Not on file  Social History Narrative   Lives with her adult daughter.   Attended [employer]. Obtained GED and then Associate's Degree at [employer].   Social Determinants of Health   Financial Resource Strain:   . Difficulty of Paying Living Expenses: Not on file  Food Insecurity:   . Worried About [facility] in the Last Year: Not on file  . Ran Out of Food in the Last Year: Not on file  Transportation Needs: No Transportation Needs  . Lack of Transportation (Medical): No  . Lack of Transportation (Non-Medical): No  Physical Activity:   . Days of Exercise per Week: Not on file  . Minutes of Exercise per Session: Not on file  Stress:   . Feeling of Stress : Not on file  Social Connections:   . Frequency of Communication with Friends and Family: Not on file  . Frequency of Social Gatherings with Friends and Family: Not on file  . Attends Religious Services: Not on file  . Active Member of Clubs or Organizations: Not on file  . Attends Programme researcher, broadcasting/film/video Meetings: Not on file  . Marital Status: Not on file  Intimate Partner Violence:   . Fear of Current or Ex-Partner: Not on file  . Emotionally Abused: Not  on file  . Physically Abused: Not on file  . Sexually Abused: Not on file    Family History  Problem Relation Age of Onset  . Cancer Maternal Grandfather        colon    The following portions of the patient's history were reviewed and updated as appropriate: allergies, current medications, past family history, past medical history, past social history, past surgical history and problem list.  Review of Systems Pertinent items are noted in HPI.   Objective:  BP 106/74   Pulse 73   Ht 6\' 1"  (1.854 m)   Wt 200 lb 1.6 oz (90.8 kg)   LMP 05/23/2020 (Within Days)   BMI 26.40 kg/m  CONSTITUTIONAL: Well-developed, well-nourished female in no acute distress.  HENT:  Normocephalic, atraumatic, External right and  left ear normal. Oropharynx is clear and moist EYES: Conjunctivae and EOM are normal. Pupils are equal, round, and reactive to light. No scleral icterus.  NECK: Normal range of motion, supple, no masses.  Normal thyroid.  SKIN: Skin is warm and dry. No rash noted. Not diaphoretic. No erythema. No pallor. NEUROLOGIC: Alert and oriented to person, place, and time. Normal reflexes, muscle tone coordination. No cranial nerve deficit noted. PSYCHIATRIC: Normal mood and affect. Normal behavior. Normal judgment and thought content. CARDIOVASCULAR: Normal heart rate noted RESPIRATORY: Effort normal, no problems with respiration noted. BREASTS: Symmetric in size. No masses, skin changes, nipple drainage, or lymphadenopathy. ABDOMEN: Soft, no distention noted.  No tenderness, rebound or guarding.  PELVIC: Normal appearing external genitalia; normal appearing vaginal mucosa and cervix. No hypopigmentation noted. No abnormal discharge noted.  Pap smear obtained. MUSCULOSKELETAL: Normal range of motion. No tenderness.  No cyanosis, clubbing, or edema.  2+ distal pulses.  Exam done with chaperone present.  FRAX score done 06/11/20: BMI: 26.2 The ten year probability of fracture (%) without BMD Major osteoporotic: 1.3 Hip Fracture: 0.1  Assessment and Plan:   1. Encounter for screening mammogram for breast cancer - 08/11/20 BREAST LTD UNI LEFT INC AXILLA; Future - MM DIAG BREAST TOMO BILATERAL; Future  2. Hx of abnormal mammogram - US BREAST LTD UNI LEFT INC AXILLA; Future - MM DIAG BREAST TOMO BILATERAL; Future  3. Encounter for well woman exam with routine gynecological exam - Cytology - PAP( [facility])  4. Well woman exam - Ambulatory referral to Gastroenterology  5. Lichen sclerosus et atrophicus of the vulva Doing well with clobetasol  6. Dysplasia of cervix, low grade (CIN 1) Repeat pap today  7. Vulvar lesion Doing well with steroid cream Refill sent - clobetasol cream (TEMOVATE)  0.05 %; Apply 1 application topically 2 (two) times daily. Wait until 1 week after biopsy to start. Use twice daily on area for 2 weeks, then switch to once daily.  Dispense: 30 g; Refill: 2  8. Counseled about COVID-19 virus infection The patient was counseled on the potential benefits and lack of known risks of COVID vaccination, during pregnancy and breastfeeding, on today's visit. The patient's questions and concerns were addressed today, including vaccine being new. The patient is not planning to get vaccinated at this time.   9. Tick bite, initial encounter F/u with PCP    Will follow up results of pap smear and manage accordingly. Encouraged improvement in diet and exercise.  COVID vaccine declined Declines STI screen. Mammogram scheduled Referral for colonoscopy today DEXA not due based on FRAX/age  Routine preventative health maintenance measures emphasized. Please refer to After Visit Summary for other  counseling recommendations.   Total face-to-face time with patient: 35 minutes. Over 50% of encounter was spent on counseling and coordination of care.   Baldemar Lenis, M.D. Attending [facility])

## 2020-06-11 NOTE — Patient Instructions (Signed)
COVID-19 Vaccine Information can be found at: [email or web address] For questions related to vaccine distribution or appointments, please email [email or web address] or call [phone].    

## 2020-06-18 LAB — CYTOLOGY - PAP
Comment: NEGATIVE
Diagnosis: NEGATIVE
High risk HPV: NEGATIVE

## 2020-06-25 ENCOUNTER — Other Ambulatory Visit: Payer: Self-pay | Admitting: Obstetrics and Gynecology

## 2020-06-25 DIAGNOSIS — N63 Unspecified lump in unspecified breast: Secondary | ICD-10-CM

## 2020-06-29 ENCOUNTER — Ambulatory Visit
Admission: RE | Admit: 2020-06-29 | Discharge: 2020-06-29 | Disposition: A | Discharge status: Home / Self Care | Payer: No Typology Code available for payment source | Source: Ambulatory Visit | Attending: Obstetrics and Gynecology | Admitting: Obstetrics and Gynecology

## 2020-06-29 ENCOUNTER — Ambulatory Visit
Admission: RE | Admit: 2020-06-29 | Discharge: 2020-06-29 | Disposition: A | Discharge status: Home / Self Care | Payer: BC Managed Care – PPO | Source: Ambulatory Visit | Attending: Obstetrics and Gynecology | Admitting: Obstetrics and Gynecology

## 2020-06-29 ENCOUNTER — Other Ambulatory Visit: Payer: Self-pay

## 2020-06-29 DIAGNOSIS — N63 Unspecified lump in unspecified breast: Secondary | ICD-10-CM

## 2020-08-17 ENCOUNTER — Encounter: Payer: Self-pay | Admitting: *Deleted

## 2020-10-22 IMAGING — US US BREAST*L* LIMITED INC AXILLA
1 series · 6 of 6 positions shown · non-contrast
Comparison: Previous exam(s).

CLINICAL DATA: Patient presents for routine annual exam. She was
recalled from screening in January 2018 for a left breast mass at 11
o'clock for which biopsy was recommended at that time. However she
did not return to have the biopsy performed.

EXAM:
DIGITAL DIAGNOSTIC BILATERAL MAMMOGRAM WITH CAD AND TOMO
ULTRASOUND LEFT BREAST

[Series 1: us breast*left* limited inc axilla · 0.06mm/px · 6 of 6 slices shown]
[im 1/6]
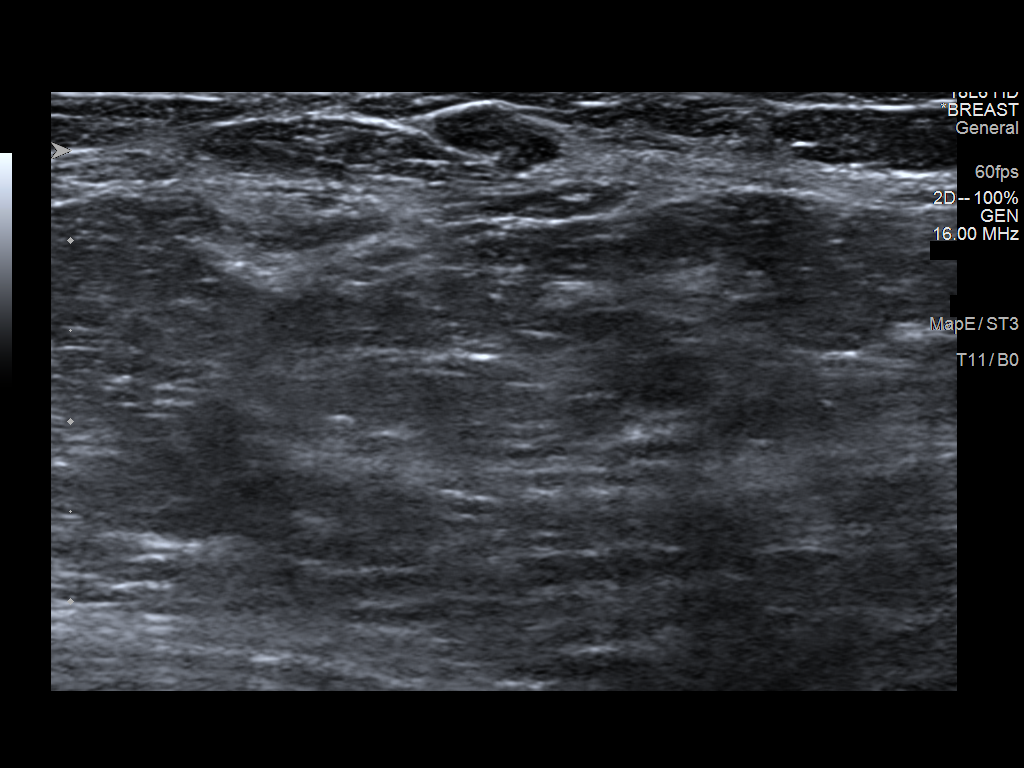
[im 2/6]
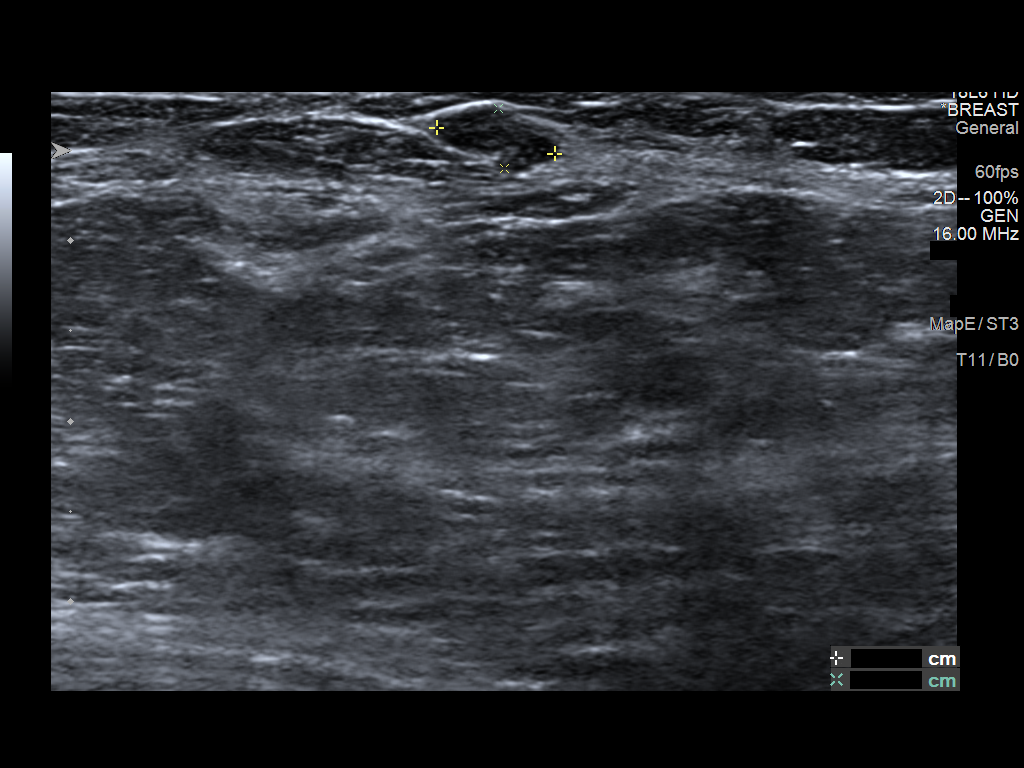
[im 3/6]
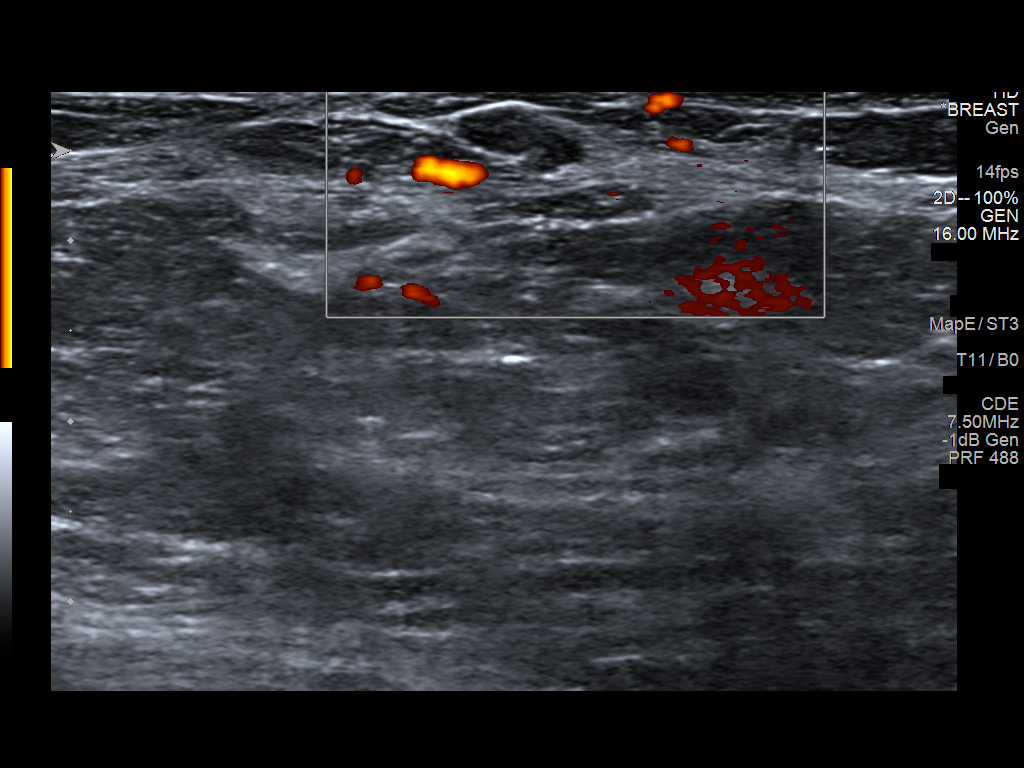
[im 4/6]
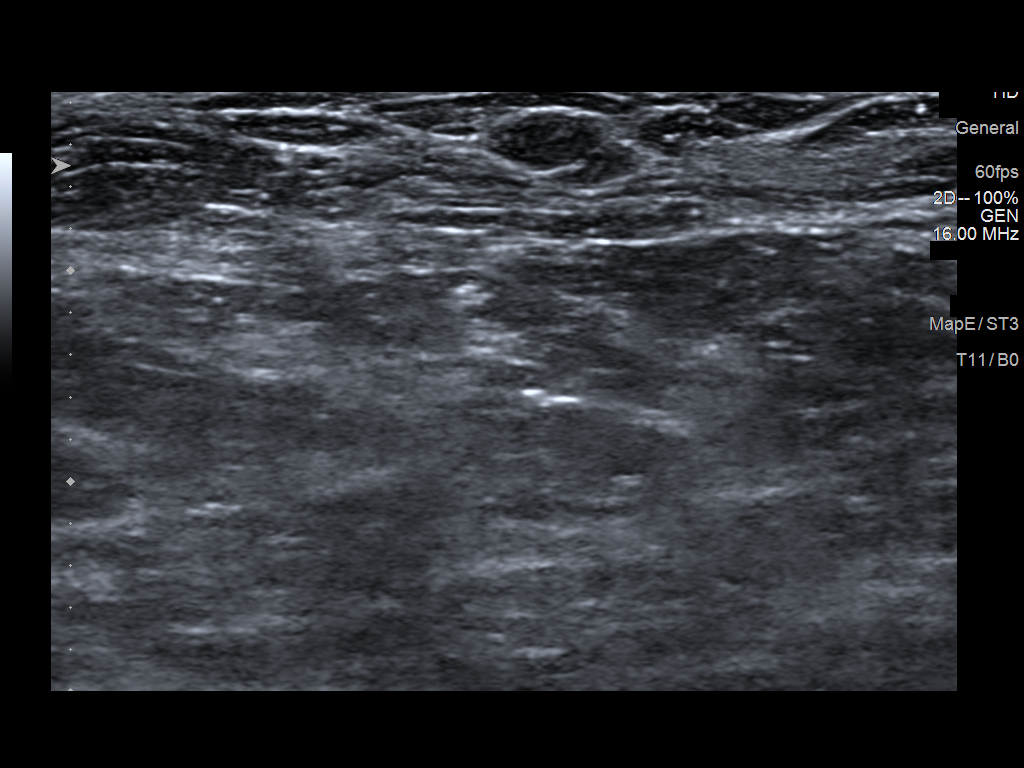
[im 5/6]
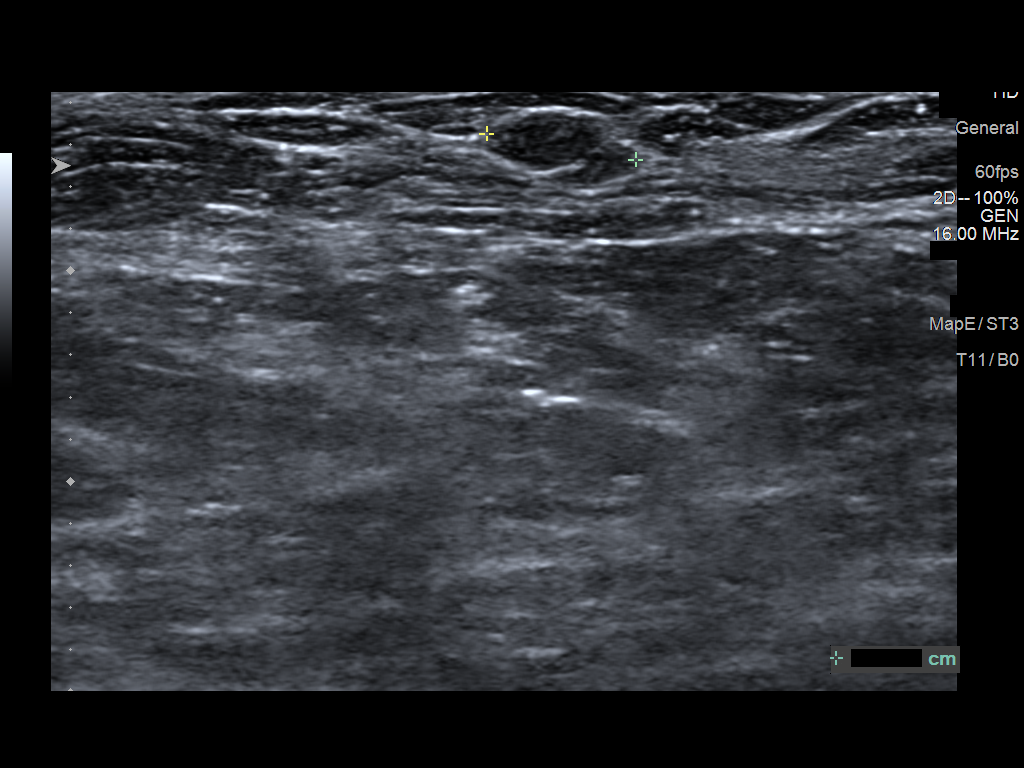
[im 6/6]
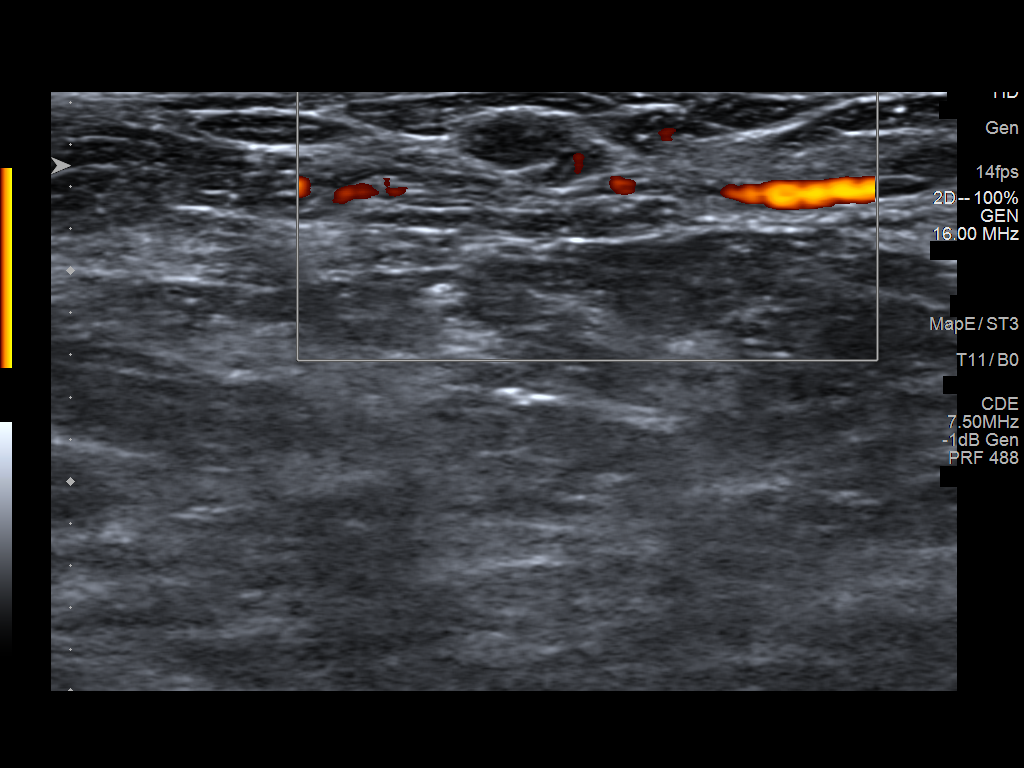

[6 of 6 positions shown; findings below may reference images not displayed]

ACR Breast Density Category c: The breast tissue is heterogeneously
dense, which may obscure small masses.
FINDINGS: Mammogram: There is a small mass in the upper inner left breast
measuring approximately 6 mm, best seen on the MLO view, which
appears stable compared to the prior mammogram. It appears less
conspicuous on the CC view today.

There are no new suspicious findings in the bilateral breasts.

Mammographic images were processed with CAD.

Targeted ultrasound is performed at 11 o'clock in the left breast
demonstrating an oval circumscribed hypoechoic mass measuring 0.7 x
0.3 x 0.7 cm, previously measuring 0.7 x 0.3 x 0.7 cm.
IMPRESSION: Left breast mass at 11 o'clock measuring 0.7 cm is stable for over a
year. Although biopsy was previously recommended, given the stable
appearance of the mass, it is probably benign.

RECOMMENDATION:
Diagnostic bilateral mammogram in 1 year and targeted left breast
ultrasound to document two year stability of the probably benign
left breast mass.

I have discussed the findings and recommendations with the patient.
If applicable, a reminder letter will be sent to the patient
regarding the next appointment.

BI-RADS CATEGORY  3: Probably benign.

## 2021-06-20 ENCOUNTER — Other Ambulatory Visit: Payer: Self-pay | Admitting: Obstetrics and Gynecology

## 2021-06-20 DIAGNOSIS — N9089 Other specified noninflammatory disorders of vulva and perineum: Secondary | ICD-10-CM

## 2021-06-22 ENCOUNTER — Other Ambulatory Visit: Payer: Self-pay | Admitting: Obstetrics and Gynecology

## 2021-06-22 DIAGNOSIS — Z1231 Encounter for screening mammogram for malignant neoplasm of breast: Secondary | ICD-10-CM

## 2021-06-24 ENCOUNTER — Other Ambulatory Visit: Payer: Self-pay

## 2021-06-24 MED ORDER — HYDROCORTISONE (PERIANAL) 2.5 % EX CREA: 1.0000 "application " | TOPICAL_CREAM | Freq: Two times a day (BID) | CUTANEOUS | 0 refills | Status: AC

## 2021-06-24 NOTE — Progress Notes (Signed)
Pt called and requesting rectal cream. Pt has hx of hemorrhoids. Pt advised will refill today, but to call and make primary care appt for follow up and possible referral to GI . Pt verbalized understanding and agreeable to plan of care.  Judeth Cornfield, RN

## 2021-12-07 ENCOUNTER — Telehealth: Payer: Self-pay | Admitting: *Deleted

## 2021-12-07 DIAGNOSIS — N9089 Other specified noninflammatory disorders of vulva and perineum: Secondary | ICD-10-CM

## 2021-12-07 MED ORDER — CLOBETASOL PROPIONATE 0.05 % EX CREA: TOPICAL_CREAM | CUTANEOUS | 2 refills | Status: AC

## 2021-12-07 NOTE — Telephone Encounter (Signed)
Tracy Montoya left a voice message today she would like a refill of her clobetasone cream. Per chart was given for vulvar lesion- lichen sclerosis. Discussed with Dr. Macon Large and refill approved. I called [facility] refill approved and sent in to pharmacy of her choice. I also reminded her it is time for her to schedule an annual exam. She voices understanding. ?Nancy Fetter ?
